# Patient Record
Sex: Male | Born: 1945 | Race: White | Hispanic: No | Smoking: Former smoker
Health system: Southern US, Community
[De-identification: ages and names within clinical notes are randomized; demographics above are authoritative.]

## PROBLEM LIST (undated history)

## (undated) DIAGNOSIS — F17200 Nicotine dependence, unspecified, uncomplicated: Secondary | ICD-10-CM

## (undated) DIAGNOSIS — E7800 Pure hypercholesterolemia, unspecified: Secondary | ICD-10-CM

## (undated) DIAGNOSIS — I10 Essential (primary) hypertension: Secondary | ICD-10-CM

## (undated) DIAGNOSIS — N529 Male erectile dysfunction, unspecified: Secondary | ICD-10-CM

## (undated) DIAGNOSIS — G4730 Sleep apnea, unspecified: Secondary | ICD-10-CM

## (undated) HISTORY — PX: BACK SURGERY: SHX140

---

## 1999-06-23 ENCOUNTER — Ambulatory Visit (HOSPITAL_COMMUNITY): Admission: RE | Admit: 1999-06-23 | Discharge: 1999-06-23 | Payer: Self-pay | Admitting: Orthopedic Surgery

## 1999-06-23 ENCOUNTER — Encounter: Payer: Self-pay | Admitting: Orthopedic Surgery

## 1999-07-06 ENCOUNTER — Encounter: Payer: Self-pay | Admitting: Orthopedic Surgery

## 1999-07-06 ENCOUNTER — Ambulatory Visit (HOSPITAL_COMMUNITY): Admission: RE | Admit: 1999-07-06 | Discharge: 1999-07-06 | Payer: Self-pay | Admitting: Orthopedic Surgery

## 1999-07-20 ENCOUNTER — Encounter: Payer: Self-pay | Admitting: Orthopedic Surgery

## 1999-07-20 ENCOUNTER — Ambulatory Visit (HOSPITAL_COMMUNITY): Admission: RE | Admit: 1999-07-20 | Discharge: 1999-07-20 | Payer: Self-pay | Admitting: Orthopedic Surgery

## 1999-08-03 ENCOUNTER — Encounter: Payer: Self-pay | Admitting: Orthopedic Surgery

## 1999-08-03 ENCOUNTER — Ambulatory Visit (HOSPITAL_COMMUNITY): Admission: RE | Admit: 1999-08-03 | Discharge: 1999-08-03 | Payer: Self-pay | Admitting: Orthopedic Surgery

## 2000-01-12 ENCOUNTER — Encounter: Payer: Self-pay | Admitting: Specialist

## 2000-01-19 ENCOUNTER — Observation Stay (HOSPITAL_COMMUNITY): Admission: RE | Admit: 2000-01-19 | Discharge: 2000-01-20 | Payer: Self-pay | Admitting: Specialist

## 2000-01-19 ENCOUNTER — Encounter: Payer: Self-pay | Admitting: Specialist

## 2009-04-06 ENCOUNTER — Ambulatory Visit: Payer: Self-pay | Admitting: Gastroenterology

## 2015-02-18 ENCOUNTER — Other Ambulatory Visit: Payer: Self-pay | Admitting: Family Medicine

## 2015-02-18 DIAGNOSIS — Z0000 Encounter for general adult medical examination without abnormal findings: Secondary | ICD-10-CM

## 2015-02-22 ENCOUNTER — Other Ambulatory Visit: Payer: Self-pay | Admitting: Family Medicine

## 2015-02-22 ENCOUNTER — Ambulatory Visit
Admission: RE | Admit: 2015-02-22 | Discharge: 2015-02-22 | Disposition: A | Discharge status: Home / Self Care | Payer: Medicare Other | Source: Ambulatory Visit | Attending: Family Medicine | Admitting: Family Medicine

## 2015-02-22 DIAGNOSIS — F17200 Nicotine dependence, unspecified, uncomplicated: Secondary | ICD-10-CM

## 2015-02-22 DIAGNOSIS — E7800 Pure hypercholesterolemia, unspecified: Secondary | ICD-10-CM

## 2015-02-22 DIAGNOSIS — I77811 Abdominal aortic ectasia: Secondary | ICD-10-CM | POA: Insufficient documentation

## 2015-02-22 DIAGNOSIS — I10 Essential (primary) hypertension: Secondary | ICD-10-CM

## 2015-02-22 DIAGNOSIS — Z0000 Encounter for general adult medical examination without abnormal findings: Secondary | ICD-10-CM

## 2015-02-22 DIAGNOSIS — IMO0001 Reserved for inherently not codable concepts without codable children: Secondary | ICD-10-CM

## 2015-02-22 DIAGNOSIS — F17210 Nicotine dependence, cigarettes, uncomplicated: Secondary | ICD-10-CM | POA: Insufficient documentation

## 2015-02-22 DIAGNOSIS — Z800 Family history of malignant neoplasm of digestive organs: Secondary | ICD-10-CM | POA: Insufficient documentation

## 2015-02-22 DIAGNOSIS — Z136 Encounter for screening for cardiovascular disorders: Secondary | ICD-10-CM | POA: Insufficient documentation

## 2015-02-22 HISTORY — DX: Essential (primary) hypertension: I10

## 2015-02-22 HISTORY — DX: Nicotine dependence, unspecified, uncomplicated: F17.200

## 2015-05-05 ENCOUNTER — Encounter: Payer: Self-pay | Admitting: *Deleted

## 2015-05-06 ENCOUNTER — Ambulatory Visit
Admission: RE | Admit: 2015-05-06 | Discharge: 2015-05-06 | Disposition: A | Discharge status: Home / Self Care | Payer: Medicare Other | Source: Ambulatory Visit | Attending: Unknown Physician Specialty | Admitting: Unknown Physician Specialty

## 2015-05-06 ENCOUNTER — Ambulatory Visit: Payer: Medicare Other | Admitting: Anesthesiology

## 2015-05-06 ENCOUNTER — Encounter: Payer: Self-pay | Admitting: *Deleted

## 2015-05-06 ENCOUNTER — Encounter
Admission: RE | Disposition: A | Discharge status: Home / Self Care | Payer: Self-pay | Source: Ambulatory Visit | Attending: Unknown Physician Specialty

## 2015-05-06 DIAGNOSIS — Z79899 Other long term (current) drug therapy: Secondary | ICD-10-CM | POA: Insufficient documentation

## 2015-05-06 DIAGNOSIS — D124 Benign neoplasm of descending colon: Secondary | ICD-10-CM | POA: Diagnosis not present

## 2015-05-06 DIAGNOSIS — J449 Chronic obstructive pulmonary disease, unspecified: Secondary | ICD-10-CM | POA: Insufficient documentation

## 2015-05-06 DIAGNOSIS — Z86010 Personal history of colonic polyps: Secondary | ICD-10-CM | POA: Diagnosis present

## 2015-05-06 DIAGNOSIS — E780 Pure hypercholesterolemia: Secondary | ICD-10-CM | POA: Diagnosis not present

## 2015-05-06 DIAGNOSIS — F17210 Nicotine dependence, cigarettes, uncomplicated: Secondary | ICD-10-CM | POA: Insufficient documentation

## 2015-05-06 DIAGNOSIS — N529 Male erectile dysfunction, unspecified: Secondary | ICD-10-CM | POA: Diagnosis not present

## 2015-05-06 DIAGNOSIS — D123 Benign neoplasm of transverse colon: Secondary | ICD-10-CM | POA: Insufficient documentation

## 2015-05-06 DIAGNOSIS — D120 Benign neoplasm of cecum: Secondary | ICD-10-CM | POA: Diagnosis not present

## 2015-05-06 DIAGNOSIS — D122 Benign neoplasm of ascending colon: Secondary | ICD-10-CM | POA: Diagnosis not present

## 2015-05-06 DIAGNOSIS — I10 Essential (primary) hypertension: Secondary | ICD-10-CM | POA: Insufficient documentation

## 2015-05-06 HISTORY — PX: COLONOSCOPY WITH PROPOFOL: SHX5780

## 2015-05-06 HISTORY — DX: Pure hypercholesterolemia, unspecified: E78.00

## 2015-05-06 HISTORY — DX: Male erectile dysfunction, unspecified: N52.9

## 2015-05-06 SURGERY — COLONOSCOPY WITH PROPOFOL
Anesthesia: General

## 2015-05-06 MED ORDER — SODIUM CHLORIDE 0.9 % IV SOLN: INTRAVENOUS | Status: DC

## 2015-05-06 MED ORDER — EPHEDRINE SULFATE 50 MG/ML IJ SOLN
INTRAMUSCULAR | Status: DC | PRN
  Administered 2015-05-06 (×2): 5 mg via INTRAVENOUS

## 2015-05-06 MED ORDER — MIDAZOLAM HCL 5 MG/5ML IJ SOLN
INTRAMUSCULAR | Status: DC | PRN
  Administered 2015-05-06: 1 mg via INTRAVENOUS

## 2015-05-06 MED ORDER — PHENYLEPHRINE HCL 10 MG/ML IJ SOLN
INTRAMUSCULAR | Status: DC | PRN
  Administered 2015-05-06: 50 ug via INTRAVENOUS
  Administered 2015-05-06: 200 ug via INTRAVENOUS
  Administered 2015-05-06: 50 ug via INTRAVENOUS
  Administered 2015-05-06: 100 ug via INTRAVENOUS

## 2015-05-06 MED ORDER — FENTANYL CITRATE (PF) 100 MCG/2ML IJ SOLN
INTRAMUSCULAR | Status: DC | PRN
  Administered 2015-05-06: 50 ug via INTRAVENOUS

## 2015-05-06 MED ORDER — SODIUM CHLORIDE 0.9 % IV SOLN
INTRAVENOUS | Status: DC
  Administered 2015-05-06: 14:00:00 via INTRAVENOUS

## 2015-05-06 MED ORDER — PROPOFOL INFUSION 10 MG/ML OPTIME
INTRAVENOUS | Status: DC | PRN
Start: 2015-05-06 — End: 2015-05-06
  Administered 2015-05-06: 120 ug/kg/min via INTRAVENOUS

## 2015-05-06 MED ORDER — PROPOFOL 10 MG/ML IV BOLUS
INTRAVENOUS | Status: DC | PRN
  Administered 2015-05-06: 55 mg via INTRAVENOUS

## 2015-05-06 NOTE — Transfer of Care (Signed)
Immediate Anesthesia Transfer of Care Note  Patient: Caleb Pena  Procedure(s) Performed: Procedure(s): COLONOSCOPY WITH PROPOFOL (N/A)  Patient Location: PACU  Anesthesia Type:General  Level of Consciousness: awake, alert  and oriented  Airway & Oxygen Therapy: Patient Spontanous Breathing and Patient connected to nasal cannula oxygen  Post-op Assessment: Report given to RN and Post -op Vital signs reviewed and stable  Post vital signs: stable  Last Vitals:  Filed Vitals:   05/06/15 1544  BP: 96/70  Pulse: 68  Temp: 35.9 C  Resp: 15    Complications: No apparent anesthesia complications

## 2015-05-06 NOTE — Op Note (Signed)
[facility] Gastroenterology Patient Name: Caleb Pena Procedure Date: 05/06/2015 2:58 PM MRN: [id number] Account #: [id number] Date of Birth: 1945/12/04 Admit Type: Outpatient Age: 69 Room: [facility] ENDO ROOM 1 Gender: Male Note Status: Finalized Procedure:         Colonoscopy Indications:       Personal history of colonic polyps Providers:         Manya Silvas, MD Referring MD:      Truitt Leep. Ingledue (Referring MD) Medicines:         Propofol per Anesthesia Complications:     No immediate complications. Procedure:         Pre-Anesthesia Assessment:                    - After reviewing the risks and benefits, the patient was                     deemed in satisfactory condition to undergo the procedure.                    After obtaining informed consent, the colonoscope was                     passed under direct vision. Throughout the procedure, the                     patient's blood pressure, pulse, and oxygen saturations                     were monitored continuously. The Colonoscope was                     introduced through the anus and advanced to the the cecum,                     identified by appendiceal orifice and ileocecal valve. The                     colonoscopy was somewhat difficult due to significant                     looping. The patient tolerated the procedure well. The                     quality of the bowel preparation was good. Findings:      Two sessile polyps were found at the hepatic flexure. The polyps were       diminutive in size. These polyps were removed with a jumbo cold forceps.       Resection and retrieval were complete.      Two sessile polyps were found in the cecum. The polyps were diminutive       in size. These polyps were removed with a jumbo cold forceps. Resection       and retrieval were complete.      A small polyp was found in the ascending colon. The polyp was sessile.       The polyp was removed with a hot  snare. Resection and retrieval were       complete. To prevent bleeding after the polypectomy, three hemostatic       clips were successfully placed (MRI compatible). There was no bleeding       during, and at the end, of the procedure.      A  small polyp was found in the transverse colon. The polyp was sessile.       The polyp was removed with a hot snare. Resection and retrieval were       complete.      Three sessile polyps were found in the descending colon. The polyps were       diminutive in size. These polyps were removed with a cold snare.       Resection and retrieval were complete.      A diminutive polyp was found in the ascending colon. The polyp was       sessile. The polyp was removed with a jumbo cold forceps. Resection and       retrieval were complete. Impression:        - Two diminutive polyps at the hepatic flexure. Resected                     and retrieved.                    - Two diminutive polyps in the cecum. Resected and                     retrieved.                    - One small polyp in the ascending colon. Resected and                     retrieved. Clips were placed.                    - One small polyp in the transverse colon. Resected and                     retrieved.                    - Three diminutive polyps in the descending colon.                     Resected and retrieved.                    - One diminutive polyp in the ascending colon. Resected                     and retrieved. Recommendation:    - Await pathology results. Manya Silvas, MD 05/06/2015 3:45:53 PM This report has been signed electronically. Number of Addenda: 0 Note Initiated On: 05/06/2015 2:58 PM Scope Withdrawal Time: 0 hours 29 minutes 15 seconds  Total Procedure Duration: 0 hours 38 minutes 3 seconds       [facility]

## 2015-05-06 NOTE — Anesthesia Postprocedure Evaluation (Signed)
  Anesthesia Post-op Note  Patient: Caleb Pena  Procedure(s) Performed: Procedure(s): COLONOSCOPY WITH PROPOFOL (N/A)  Anesthesia type:General  Patient location: PACU  Post pain: Pain level controlled  Post assessment: Post-op Vital signs reviewed, Patient's Cardiovascular Status Stable, Respiratory Function Stable, Patent Airway and No signs of Nausea or vomiting  Post vital signs: Reviewed and stable  Last Vitals:  Filed Vitals:   05/06/15 1550  BP: 106/72  Pulse: 76  Temp:   Resp: 15    Level of consciousness: awake, alert  and patient cooperative  Complications: No apparent anesthesia complications

## 2015-05-06 NOTE — H&P (Signed)
Primary Care Physician:  Chrisandra Carota, MD Primary Gastroenterologist:  Dr. Vira Agar  Pre-Procedure History & Physical: HPI:  Caleb Pena is a 69 y.o. male is here for an colonoscopy.   Past Medical History  Diagnosis Date  . Hypertension   . Smoker   . Hypercholesterolemia   . ED (erectile dysfunction)     Past Surgical History  Procedure Laterality Date  . Back surgery      Prior to Admission medications   Medication Sig Start Date End Date Taking? Authorizing Provider  buPROPion (WELLBUTRIN SR) 150 MG 12 hr tablet Take 150 mg by mouth 2 (two) times daily.   Yes Historical Provider, MD  losartan (COZAAR) 50 MG tablet Take 50 mg by mouth daily.   Yes Historical Provider, MD  rOPINIRole (REQUIP) 0.25 MG tablet Take 0.25 mg by mouth 3 (three) times daily.   Yes Historical Provider, MD  sildenafil (REVATIO) 20 MG tablet Take 20 mg by mouth 3 (three) times daily.   Yes Historical Provider, MD  simvastatin (ZOCOR) 40 MG tablet Take 40 mg by mouth daily.   Yes Historical Provider, MD  lisinopril (PRINIVIL,ZESTRIL) 5 MG tablet Take 5 mg by mouth daily.    Historical Provider, MD    Allergies as of 05/04/2015  . (Not on File)    History reviewed. No pertinent family history.  History   Social History  . Marital Status: Widowed    Spouse Name: N/A  . Number of Children: N/A  . Years of Education: N/A   Occupational History  . Not on file.   Social History Main Topics  . Smoking status: Current Every Day Smoker -- 0.50 packs/day    Types: Cigarettes  . Smokeless tobacco: Former Systems developer    Types: Chew    Quit date: 05/05/1983  . Alcohol Use: Not on file  . Drug Use: Not on file  . Sexual Activity: Not on file   Other Topics Concern  . Not on file   Social History Narrative    Review of Systems: See HPI, otherwise negative ROS  Physical Exam: BP 117/98 mmHg  Pulse 85  Temp(Src) 97.3 F (36.3 C) (Tympanic)  Resp 12  Ht 5\' 11"  (1.803 m)  Wt 88.905 kg (196  lb)  BMI 27.35 kg/m2  SpO2 100% General:   Alert,  pleasant and cooperative in NAD Head:  Normocephalic and atraumatic. Neck:  Supple; no masses or thyromegaly. Lungs:  Clear throughout to auscultation.    Heart:  Regular rate and rhythm. Abdomen:  Soft, nontender and nondistended. Normal bowel sounds, without guarding, and without rebound.   Neurologic:  Alert and  oriented x4;  grossly normal neurologically.  Impression/Plan: Caleb Pena is here for an colonoscopy to be performed for personal hx colon polyps  Risks, benefits, limitations, and alternatives regarding  colonoscopy have been reviewed with the patient.  Questions have been answered.  All parties agreeable.   Caleb Cheers, MD  05/06/2015, 2:57 PM   Primary Care Physician:  Chrisandra Carota, MD Primary Gastroenterologist:  Dr. Vira Agar  Pre-Procedure History & Physical: HPI:  Caleb Pena is a 69 y.o. male is here for an colonoscopy.   Past Medical History  Diagnosis Date  . Hypertension   . Smoker   . Hypercholesterolemia   . ED (erectile dysfunction)     Past Surgical History  Procedure Laterality Date  . Back surgery      Prior to Admission medications   Medication Sig Start  Date End Date Taking? Authorizing Provider  buPROPion (WELLBUTRIN SR) 150 MG 12 hr tablet Take 150 mg by mouth 2 (two) times daily.   Yes Historical Provider, MD  losartan (COZAAR) 50 MG tablet Take 50 mg by mouth daily.   Yes Historical Provider, MD  rOPINIRole (REQUIP) 0.25 MG tablet Take 0.25 mg by mouth 3 (three) times daily.   Yes Historical Provider, MD  sildenafil (REVATIO) 20 MG tablet Take 20 mg by mouth 3 (three) times daily.   Yes Historical Provider, MD  simvastatin (ZOCOR) 40 MG tablet Take 40 mg by mouth daily.   Yes Historical Provider, MD  lisinopril (PRINIVIL,ZESTRIL) 5 MG tablet Take 5 mg by mouth daily.    Historical Provider, MD    Allergies as of 05/04/2015  . (Not on File)    History reviewed. No pertinent  family history.  History   Social History  . Marital Status: Widowed    Spouse Name: N/A  . Number of Children: N/A  . Years of Education: N/A   Occupational History  . Not on file.   Social History Main Topics  . Smoking status: Current Every Day Smoker -- 0.50 packs/day    Types: Cigarettes  . Smokeless tobacco: Former Systems developer    Types: Chew    Quit date: 05/05/1983  . Alcohol Use: Not on file  . Drug Use: Not on file  . Sexual Activity: Not on file   Other Topics Concern  . Not on file   Social History Narrative    Review of Systems: See HPI, otherwise negative ROS  Physical Exam: BP 117/98 mmHg  Pulse 85  Temp(Src) 97.3 F (36.3 C) (Tympanic)  Resp 12  Ht 5\' 11"  (1.803 m)  Wt 88.905 kg (196 lb)  BMI 27.35 kg/m2  SpO2 100% General:   Alert,  pleasant and cooperative in NAD Head:  Normocephalic and atraumatic. Neck:  Supple; no masses or thyromegaly. Lungs:  Clear throughout to auscultation.    Heart:  Regular rate and rhythm. Abdomen:  Soft, nontender and nondistended. Normal bowel sounds, without guarding, and without rebound.   Neurologic:  Alert and  oriented x4;  grossly normal neurologically.  Impression/Plan: Caleb Pena is here for an colonoscopy to be performed for personal history colon polyps  Risks, benefits, limitations, and alternatives regarding  colonoscopy have been reviewed with the patient.  Questions have been answered.  All parties agreeable.   Caleb Cheers, MD  05/06/2015, 2:57 PM

## 2015-05-06 NOTE — Anesthesia Preprocedure Evaluation (Signed)
Anesthesia Evaluation  Patient identified by MRN, date of birth, ID band Patient awake    Reviewed: Allergy & Precautions, NPO status , Patient's Chart, lab work & pertinent test results  Airway Mallampati: III       Dental  (+) Chipped   Pulmonary COPDCurrent Smoker,  + rhonchi         Cardiovascular hypertension, Pt. on medications Normal cardiovascular exam    Neuro/Psych    GI/Hepatic negative GI ROS, Neg liver ROS,   Endo/Other  negative endocrine ROS  Renal/GU negative Renal ROS     Musculoskeletal negative musculoskeletal ROS (+)   Abdominal Normal abdominal exam  (+)   Peds  Hematology negative hematology ROS (+)   Anesthesia Other Findings   Reproductive/Obstetrics negative OB ROS                             Anesthesia Physical Anesthesia Plan  ASA: II  Anesthesia Plan: General   Post-op Pain Management:    Induction: Intravenous  Airway Management Planned: Nasal Cannula  Additional Equipment:   Intra-op Plan:   Post-operative Plan:   Informed Consent: I have reviewed the patients History and Physical, chart, labs and discussed the procedure including the risks, benefits and alternatives for the proposed anesthesia with the patient or authorized representative who has indicated his/her understanding and acceptance.     Plan Discussed with: CRNA  Anesthesia Plan Comments:         Anesthesia Quick Evaluation

## 2015-05-10 ENCOUNTER — Encounter: Payer: Self-pay | Admitting: Unknown Physician Specialty

## 2015-05-10 LAB — SURGICAL PATHOLOGY

## 2016-01-10 ENCOUNTER — Other Ambulatory Visit: Payer: Self-pay | Admitting: Family Medicine

## 2016-01-10 DIAGNOSIS — Z136 Encounter for screening for cardiovascular disorders: Secondary | ICD-10-CM

## 2018-10-10 ENCOUNTER — Inpatient Hospital Stay
Admission: EM | Admit: 2018-10-10 | Discharge: 2018-10-12 | DRG: 683 | Disposition: A | Discharge status: Home / Self Care | Payer: Medicare Other | Attending: Internal Medicine | Admitting: Internal Medicine

## 2018-10-10 ENCOUNTER — Encounter: Payer: Self-pay | Admitting: Emergency Medicine

## 2018-10-10 ENCOUNTER — Emergency Department: Payer: Medicare Other

## 2018-10-10 ENCOUNTER — Other Ambulatory Visit: Payer: Self-pay

## 2018-10-10 DIAGNOSIS — I10 Essential (primary) hypertension: Secondary | ICD-10-CM | POA: Diagnosis present

## 2018-10-10 DIAGNOSIS — R197 Diarrhea, unspecified: Secondary | ICD-10-CM | POA: Diagnosis not present

## 2018-10-10 DIAGNOSIS — G2581 Restless legs syndrome: Secondary | ICD-10-CM | POA: Diagnosis present

## 2018-10-10 DIAGNOSIS — Z79899 Other long term (current) drug therapy: Secondary | ICD-10-CM | POA: Diagnosis not present

## 2018-10-10 DIAGNOSIS — A0811 Acute gastroenteropathy due to Norwalk agent: Secondary | ICD-10-CM | POA: Diagnosis present

## 2018-10-10 DIAGNOSIS — K219 Gastro-esophageal reflux disease without esophagitis: Secondary | ICD-10-CM | POA: Diagnosis present

## 2018-10-10 DIAGNOSIS — F329 Major depressive disorder, single episode, unspecified: Secondary | ICD-10-CM | POA: Diagnosis present

## 2018-10-10 DIAGNOSIS — Z888 Allergy status to other drugs, medicaments and biological substances status: Secondary | ICD-10-CM | POA: Diagnosis not present

## 2018-10-10 DIAGNOSIS — K529 Noninfective gastroenteritis and colitis, unspecified: Secondary | ICD-10-CM

## 2018-10-10 DIAGNOSIS — E7800 Pure hypercholesterolemia, unspecified: Secondary | ICD-10-CM | POA: Diagnosis present

## 2018-10-10 DIAGNOSIS — R109 Unspecified abdominal pain: Secondary | ICD-10-CM

## 2018-10-10 DIAGNOSIS — Z803 Family history of malignant neoplasm of breast: Secondary | ICD-10-CM | POA: Diagnosis not present

## 2018-10-10 DIAGNOSIS — K56609 Unspecified intestinal obstruction, unspecified as to partial versus complete obstruction: Secondary | ICD-10-CM

## 2018-10-10 DIAGNOSIS — N179 Acute kidney failure, unspecified: Principal | ICD-10-CM | POA: Diagnosis present

## 2018-10-10 DIAGNOSIS — F419 Anxiety disorder, unspecified: Secondary | ICD-10-CM | POA: Diagnosis present

## 2018-10-10 DIAGNOSIS — E785 Hyperlipidemia, unspecified: Secondary | ICD-10-CM | POA: Diagnosis present

## 2018-10-10 DIAGNOSIS — F17210 Nicotine dependence, cigarettes, uncomplicated: Secondary | ICD-10-CM | POA: Diagnosis present

## 2018-10-10 DIAGNOSIS — K56600 Partial intestinal obstruction, unspecified as to cause: Secondary | ICD-10-CM | POA: Diagnosis present

## 2018-10-10 DIAGNOSIS — Z800 Family history of malignant neoplasm of digestive organs: Secondary | ICD-10-CM

## 2018-10-10 DIAGNOSIS — R112 Nausea with vomiting, unspecified: Secondary | ICD-10-CM | POA: Insufficient documentation

## 2018-10-10 DIAGNOSIS — E860 Dehydration: Secondary | ICD-10-CM | POA: Diagnosis present

## 2018-10-10 LAB — URINALYSIS, COMPLETE (UACMP) WITH MICROSCOPIC
Bacteria, UA: NONE SEEN
Bilirubin Urine: NEGATIVE
Glucose, UA: NEGATIVE mg/dL
Ketones, ur: NEGATIVE mg/dL
Leukocytes, UA: NEGATIVE
NITRITE: NEGATIVE
PROTEIN: 30 mg/dL — AB
SPECIFIC GRAVITY, URINE: 1.025 (ref 1.005–1.030)
Waxy Casts, UA: 12
pH: 5 (ref 5.0–8.0)

## 2018-10-10 LAB — GASTROINTESTINAL PANEL BY PCR, STOOL (REPLACES STOOL CULTURE)
ADENOVIRUS F40/41: NOT DETECTED
Astrovirus: NOT DETECTED
CAMPYLOBACTER SPECIES: NOT DETECTED
CYCLOSPORA CAYETANENSIS: NOT DETECTED
Cryptosporidium: NOT DETECTED
ENTEROPATHOGENIC E COLI (EPEC): NOT DETECTED
Entamoeba histolytica: NOT DETECTED
Enteroaggregative E coli (EAEC): NOT DETECTED
Enterotoxigenic E coli (ETEC): NOT DETECTED
Giardia lamblia: NOT DETECTED
Norovirus GI/GII: DETECTED — AB
PLESIMONAS SHIGELLOIDES: NOT DETECTED
ROTAVIRUS A: NOT DETECTED
SALMONELLA SPECIES: NOT DETECTED
SHIGA LIKE TOXIN PRODUCING E COLI (STEC): NOT DETECTED
SHIGELLA/ENTEROINVASIVE E COLI (EIEC): NOT DETECTED
Sapovirus (I, II, IV, and V): NOT DETECTED
Vibrio cholerae: NOT DETECTED
Vibrio species: NOT DETECTED
Yersinia enterocolitica: NOT DETECTED

## 2018-10-10 LAB — COMPREHENSIVE METABOLIC PANEL
ALK PHOS: 50 U/L (ref 38–126)
ALT: 42 U/L (ref 0–44)
AST: 50 U/L — AB (ref 15–41)
Albumin: 4.4 g/dL (ref 3.5–5.0)
Anion gap: 16 — ABNORMAL HIGH (ref 5–15)
BILIRUBIN TOTAL: 0.9 mg/dL (ref 0.3–1.2)
BUN: 50 mg/dL — AB (ref 8–23)
CALCIUM: 9.1 mg/dL (ref 8.9–10.3)
CO2: 25 mmol/L (ref 22–32)
Chloride: 96 mmol/L — ABNORMAL LOW (ref 98–111)
Creatinine, Ser: 2.69 mg/dL — ABNORMAL HIGH (ref 0.61–1.24)
GFR calc Af Amer: 27 mL/min — ABNORMAL LOW (ref >60)
GFR, EST NON AFRICAN AMERICAN: 23 mL/min — AB (ref >60)
Glucose, Bld: 161 mg/dL — ABNORMAL HIGH (ref 70–99)
POTASSIUM: 3.7 mmol/L (ref 3.5–5.1)
Sodium: 137 mmol/L (ref 135–145)
Total Protein: 8.2 g/dL — ABNORMAL HIGH (ref 6.5–8.1)

## 2018-10-10 LAB — CBC
HCT: 49.7 % (ref 39.0–52.0)
HEMOGLOBIN: 16.8 g/dL (ref 13.0–17.0)
MCH: 30.1 pg (ref 26.0–34.0)
MCHC: 33.8 g/dL (ref 30.0–36.0)
MCV: 88.9 fL (ref 80.0–100.0)
Platelets: 254 10*3/uL (ref 150–400)
RBC: 5.59 MIL/uL (ref 4.22–5.81)
RDW: 13.1 % (ref 11.5–15.5)
WBC: 7.5 10*3/uL (ref 4.0–10.5)
nRBC: 0 % (ref 0.0–0.2)

## 2018-10-10 LAB — INFLUENZA PANEL BY PCR (TYPE A & B)
INFLBPCR: NEGATIVE
Influenza A By PCR: NEGATIVE

## 2018-10-10 LAB — LIPASE, BLOOD: Lipase: 23 U/L (ref 11–51)

## 2018-10-10 LAB — CK: Total CK: 592 U/L — ABNORMAL HIGH (ref 49–397)

## 2018-10-10 MED ORDER — ACETAMINOPHEN 650 MG RE SUPP: 650.0000 mg | Freq: Four times a day (QID) | RECTAL | Status: DC | PRN

## 2018-10-10 MED ORDER — PANTOPRAZOLE SODIUM 40 MG IV SOLR
40.0000 mg | Freq: Two times a day (BID) | INTRAVENOUS | Status: DC
  Administered 2018-10-10 – 2018-10-11 (×2): 40 mg via INTRAVENOUS
  Filled 2018-10-10 (×2): qty 40

## 2018-10-10 MED ORDER — SODIUM CHLORIDE 0.9 % IV SOLN
INTRAVENOUS | Status: DC
  Administered 2018-10-10 – 2018-10-11 (×3): via INTRAVENOUS

## 2018-10-10 MED ORDER — SODIUM CHLORIDE 0.9 % IV BOLUS
500.0000 mL | Freq: Once | INTRAVENOUS | Status: AC
  Administered 2018-10-10: 500 mL via INTRAVENOUS

## 2018-10-10 MED ORDER — TRAZODONE HCL 50 MG PO TABS: 25.0000 mg | ORAL_TABLET | Freq: Every evening | ORAL | Status: DC | PRN

## 2018-10-10 MED ORDER — BUPROPION HCL ER (SR) 150 MG PO TB12
150.0000 mg | ORAL_TABLET | Freq: Two times a day (BID) | ORAL | Status: DC
  Filled 2018-10-10 (×5): qty 1

## 2018-10-10 MED ORDER — HEPARIN SODIUM (PORCINE) 5000 UNIT/ML IJ SOLN
5000.0000 [IU] | Freq: Three times a day (TID) | INTRAMUSCULAR | Status: DC
  Administered 2018-10-10 – 2018-10-11 (×2): 5000 [IU] via SUBCUTANEOUS
  Filled 2018-10-10 (×4): qty 1

## 2018-10-10 MED ORDER — ONDANSETRON HCL 4 MG/2ML IJ SOLN: 4.0000 mg | Freq: Four times a day (QID) | INTRAMUSCULAR | Status: DC | PRN

## 2018-10-10 MED ORDER — SODIUM CHLORIDE 0.9 % IV BOLUS
1000.0000 mL | Freq: Once | INTRAVENOUS | Status: AC
  Administered 2018-10-10: 1000 mL via INTRAVENOUS

## 2018-10-10 MED ORDER — ONDANSETRON HCL 40 MG/20ML IJ SOLN
8.0000 mg | Freq: Once | INTRAMUSCULAR | Status: AC
  Administered 2018-10-10: 8 mg via INTRAVENOUS
  Filled 2018-10-10: qty 4

## 2018-10-10 MED ORDER — ONDANSETRON HCL 4 MG PO TABS: 4.0000 mg | ORAL_TABLET | Freq: Four times a day (QID) | ORAL | Status: DC | PRN

## 2018-10-10 MED ORDER — ROPINIROLE HCL 0.25 MG PO TABS
0.2500 mg | ORAL_TABLET | Freq: Three times a day (TID) | ORAL | Status: DC
  Administered 2018-10-10 – 2018-10-12 (×3): 0.25 mg via ORAL
  Filled 2018-10-10 (×8): qty 1

## 2018-10-10 MED ORDER — ACETAMINOPHEN 325 MG PO TABS: 650.0000 mg | ORAL_TABLET | Freq: Four times a day (QID) | ORAL | Status: DC | PRN

## 2018-10-10 NOTE — Consult Note (Signed)
Date of Consultation:  10/10/2018  Requesting Physician:  Delman Kitten, MD  Reason for Consultation:  Nausea, vomiting, diarrhea  History of Present Illness: Caleb Pena is a 72 y.o. male presenting with a two day history of nausea, vomiting, and diarrhea.  He had a salad at [facility] Tuesday on 12/24 and hours later started having diarrhea and multiple bouts of emesis.  The diarrhea subsided on 12/26 but he continues having nausea and vomiting.  He is still having flatus and had some today.  He reports abdominal pain, but describes it as muscular due to all the straining from vomiting.  He denies any chest pain or shortness of breath, fevers, or chills.  He denies any sick contacts and his fiancee who also ate at [facility] Tuesday has not had any symptoms.    Of note, he has not had any abdominal surgeries in the past.  He had a colonoscopy in July 2016 which found polyps throughout his colon which resulted in tubular adenomas without any high grade dysplasia or malignancy.  He reports another colonoscopy July 2019 but I cannot find any reports on it.  Past Medical History: Past Medical History:  Diagnosis Date  . ED (erectile dysfunction)   . Hypercholesterolemia   . Hypertension   . Smoker      Past Surgical History: Past Surgical History:  Procedure Laterality Date  . BACK SURGERY    . COLONOSCOPY WITH PROPOFOL N/A 05/06/2015   Procedure: COLONOSCOPY WITH PROPOFOL;  Surgeon: Manya Silvas, MD;  Location: [facility] ENDOSCOPY;  Service: Endoscopy;  Laterality: N/A;    Home Medications: Prior to Admission medications   Medication Sig Start Date End Date Taking? Authorizing Provider  buPROPion (WELLBUTRIN SR) 150 MG 12 hr tablet Take 150 mg by mouth 2 (two) times daily.    [provider]  lisinopril (PRINIVIL,ZESTRIL) 5 MG tablet Take 5 mg by mouth daily.    [provider]  losartan (COZAAR) 50 MG tablet Take 50 mg by mouth daily.    [provider]  rOPINIRole  (REQUIP) 0.25 MG tablet Take 0.25 mg by mouth 3 (three) times daily.    [provider]  sildenafil (REVATIO) 20 MG tablet Take 20 mg by mouth 3 (three) times daily.    [provider]  simvastatin (ZOCOR) 40 MG tablet Take 40 mg by mouth daily.    [provider]    Allergies: Allergies  Allergen Reactions  . Lisinopril Cough    Social History:  reports that he has been smoking cigarettes. He has been smoking about 0.50 packs per day. He quit smokeless tobacco use about 35 years ago.  His smokeless tobacco use included chew. No history on file for alcohol and drug.   Family History: History of breast cancer in his mother, colon cancer in his father, atrial fibrillation in his sister.  Review of Systems: Review of Systems  Constitutional: Negative for chills and fever.  HENT: Negative for hearing loss.   Respiratory: Negative for shortness of breath.   Cardiovascular: Negative for chest pain.  Gastrointestinal: Positive for abdominal pain, diarrhea, nausea and vomiting.  Genitourinary: Negative for dysuria.  Musculoskeletal: Positive for myalgias.  Skin: Negative for rash.  Neurological: Negative for dizziness.  Psychiatric/Behavioral: Negative for depression.    Physical Exam BP 121/85 (BP Location: Left Arm)   Pulse (!) 112   Temp 98.9 F (37.2 C) (Oral)   Resp (!) 22   Ht 5\' 10"  (1.778 m)   Wt  95.3 kg   SpO2 96%   BMI 30.13 kg/m  CONSTITUTIONAL: No acute distress HEENT:  Normocephalic, atraumatic, extraocular motion intact. NECK: Trachea is midline, and there is no jugular venous distension. RESPIRATORY:  Lungs are clear, and breath sounds are equal bilaterally. Normal respiratory effort without pathologic use of accessory muscles. CARDIOVASCULAR: Heart is regular without murmurs, gallops, or rubs. GI: The abdomen is soft, mildly distended, non-tender to palpation. No abdominal scars or masses.  MUSCULOSKELETAL:  Normal muscle strength and  tone in all four extremities.  No peripheral edema or cyanosis. SKIN: Skin turgor is normal. There are no pathologic skin lesions.  NEUROLOGIC:  Motor and sensation is grossly normal.  Cranial nerves are grossly intact. PSYCH:  Alert and oriented to person, place and time. Affect is normal.  Laboratory Analysis: Results for orders placed or performed during the hospital encounter of 10/10/18 (from the past 24 hour(s))  Lipase, blood     Status: None   Collection Time: 10/10/18 12:37 PM  Result Value Ref Range   Lipase 23 11 - 51 U/L  Comprehensive metabolic panel     Status: Abnormal   Collection Time: 10/10/18 12:37 PM  Result Value Ref Range   Sodium 137 135 - 145 mmol/L   Potassium 3.7 3.5 - 5.1 mmol/L   Chloride 96 (L) 98 - 111 mmol/L   CO2 25 22 - 32 mmol/L   Glucose, Bld 161 (H) 70 - 99 mg/dL   BUN 50 (H) 8 - 23 mg/dL   Creatinine, Ser 2.69 (H) 0.61 - 1.24 mg/dL   Calcium 9.1 8.9 - 10.3 mg/dL   Total Protein 8.2 (H) 6.5 - 8.1 g/dL   Albumin 4.4 3.5 - 5.0 g/dL   AST 50 (H) 15 - 41 U/L   ALT 42 0 - 44 U/L   Alkaline Phosphatase 50 38 - 126 U/L   Total Bilirubin 0.9 0.3 - 1.2 mg/dL   GFR calc non Af Amer 23 (L) >60 mL/min   GFR calc Af Amer 27 (L) >60 mL/min   Anion gap 16 (H) 5 - 15  CBC     Status: None   Collection Time: 10/10/18 12:37 PM  Result Value Ref Range   WBC 7.5 4.0 - 10.5 K/uL   RBC 5.59 4.22 - 5.81 MIL/uL   Hemoglobin 16.8 13.0 - 17.0 g/dL   HCT 49.7 39.0 - 52.0 %   MCV 88.9 80.0 - 100.0 fL   MCH 30.1 26.0 - 34.0 pg   MCHC 33.8 30.0 - 36.0 g/dL   RDW 13.1 11.5 - 15.5 %   Platelets 254 150 - 400 K/uL   nRBC 0.0 0.0 - 0.2 %  CK     Status: Abnormal   Collection Time: 10/10/18 12:37 PM  Result Value Ref Range   Total CK 592 (H) 49 - 397 U/L  Influenza panel by PCR (type A & B)     Status: None   Collection Time: 10/10/18  4:15 PM  Result Value Ref Range   Influenza A By PCR NEGATIVE NEGATIVE   Influenza B By PCR NEGATIVE NEGATIVE    Imaging: Ct  Abdomen Pelvis Wo Contrast  Result Date: 10/10/2018 CLINICAL DATA:  Generalized abdominal pain with nausea, vomiting and diarrhea. EXAM: CT ABDOMEN AND PELVIS WITHOUT CONTRAST TECHNIQUE: Multidetector CT imaging of the abdomen and pelvis was performed following the standard protocol without IV contrast. COMPARISON:  None. FINDINGS: Lower chest: No acute abnormality. Hepatobiliary: Tiny cystic areas in the liver  likely relate to benign cysts. No gallstones, gallbladder wall thickening, or biliary dilatation. Pancreas: Unremarkable. No pancreatic ductal dilatation or surrounding inflammatory changes. Spleen: Normal in size without focal abnormality. Adrenals/Urinary Tract: Adrenal glands are unremarkable. Kidneys are normal, without renal calculi, focal lesion, or hydronephrosis. Bladder is unremarkable. Stomach/Bowel: Dilated and fluid-filled small bowel loops extend into the pelvis. Maximal small bowel caliber is approximately 4 cm. There is some relative transition in the pelvis to more normal caliber ileum. The colon contains some fluid and gas. Findings are likely consistent with partial small bowel obstruction or significant ileus. Diverticulosis of the sigmoid colon present without evidence of diverticulitis. No free air. No bowel lesions identified. Vascular/Lymphatic: No significant vascular findings are present. No enlarged abdominal or pelvic lymph nodes. Reproductive: Prostate is unremarkable. Other: Small left inguinal hernia contains fat. No abdominopelvic ascites. Musculoskeletal: Moderate degenerative disc disease throughout the lumbar spine. IMPRESSION: Dilated and fluid-filled small bowel loops extending into the pelvis. Findings are likely consistent with partial small bowel obstruction or significant small bowel ileus. Electronically Signed   By: Aletta Edouard M.D.   On: 10/10/2018 16:05    Assessment and Plan: This is a 72 y.o. male with nausea, vomiting, diarrhea.  I have independently  viewed the patient's imaging study and reviewed his laboratory studies.  Overall, his CT scan does show distended loops of small bowel, but no specific transition point, though distal small bowel is normal in caliber.  No areas of stranding or colitis.  His labs show acute renal failure with Cr of 2.69 (normal 1.3 on 04/22/18), and he is hemoconcentrated.  Discussed with the patient that without prior surgical history and with the timeline of his presentation, initial symptoms, and current symptoms, more likely he has a severe bout of gastroenteritis.  He could indeed have a partial small bowel obstruction, but this is less likely, and I did discuss with the patient that we may never fully find out which one it is.  I do not think he would need surgery and I think medical admission is appropriate.  I would recommend to keep him NPO given his nausea/vomiting, and give him aggressive IV fluid hydration.  He is aware that if there is no improvement in his symptoms, he may need an NG tube until his bowel function improves.  We will continue to follow.  Face-to-face time spent with the patient and care providers was 55 minutes, with more than 50% of the time spent counseling, educating, and coordinating care of the patient.     Melvyn Neth, MD [facility] Pg:  [phone]

## 2018-10-10 NOTE — ED Notes (Signed)
Patient transported to CT 

## 2018-10-10 NOTE — H&P (Signed)
[facility] NAME: Caleb Pena    MR#:  [id number]  DATE OF BIRTH:  11/12/1945  DATE OF ADMISSION:  10/10/2018  PRIMARY CARE PHYSICIAN: Clarisse Gouge, MD   REQUESTING/REFERRING PHYSICIAN: Dr. Delman Kitten  CHIEF COMPLAINT: Intractable nausea, vomiting   Chief Complaint  Patient presents with  . Emesis    HISTORY OF PRESENT ILLNESS:  Caleb Pena  is a 72 y.o. male with a known history of restless leg syndrome comes in because of intractable nausea, vomiting, diarrhea.  Patient ate salad a to be Tuesday, symptoms started after, started to have multiple episodes of nausea, vomiting, diarrhea, stomach cramping.  Diarrhea stopped but now but still has dry heaving.  Patient blood work showed acute acute kidney injury, CT abdomen concerning for partial small bowel obstruction.pt able to pass flattus.  PAST MEDICAL HISTORY:   Past Medical History:  Diagnosis Date  . ED (erectile dysfunction)   . Hypercholesterolemia   . Hypertension   . Smoker     PAST SURGICAL HISTOIRY:   Past Surgical History:  Procedure Laterality Date  . BACK SURGERY    . COLONOSCOPY WITH PROPOFOL N/A 05/06/2015   Procedure: COLONOSCOPY WITH PROPOFOL;  Surgeon: Manya Silvas, MD;  Location: [facility] ENDOSCOPY;  Service: Endoscopy;  Laterality: N/A;    SOCIAL HISTORY:   Social History   Tobacco Use  . Smoking status: Current Every Day Smoker    Packs/day: 0.50    Types: Cigarettes  . Smokeless tobacco: Former Systems developer    Types: Chew    Quit date: 05/05/1983  Substance Use Topics  . Alcohol use: Not on file    FAMILY HISTORY:  No family history on file.  DRUG ALLERGIES:   Allergies  Allergen Reactions  . Lisinopril Cough    REVIEW OF SYSTEMS:  CONSTITUTIONAL: Fatigue EYES: No blurred or double vision.  EARS, NOSE, AND THROAT: No tinnitus or ear pain.  RESPIRATORY: No cough, shortness of breath, wheezing or hemoptysis.  CARDIOVASCULAR:  No chest pain, orthopnea, edema.  GASTROINTESTINAL: Nausea, vomiting, diarrhea   GENITOURINARY: No dysuria, hematuria.  ENDOCRINE: No polyuria, nocturia,  HEMATOLOGY: No anemia, easy bruising or bleeding SKIN: No rash or lesion. MUSCULOSKELETAL: No joint pain or arthritis.   NEUROLOGIC: No tingling, numbness, weakness.  PSYCHIATRY: No anxiety or depression.   MEDICATIONS AT HOME:   Prior to Admission medications   Medication Sig Start Date End Date Taking? Authorizing Provider  buPROPion (WELLBUTRIN SR) 150 MG 12 hr tablet Take 150 mg by mouth 2 (two) times daily.    [provider]  lisinopril (PRINIVIL,ZESTRIL) 5 MG tablet Take 5 mg by mouth daily.    [provider]  losartan (COZAAR) 50 MG tablet Take 50 mg by mouth daily.    [provider]  rOPINIRole (REQUIP) 0.25 MG tablet Take 0.25 mg by mouth 3 (three) times daily.    [provider]  sildenafil (REVATIO) 20 MG tablet Take 20 mg by mouth 3 (three) times daily.    [provider]  simvastatin (ZOCOR) 40 MG tablet Take 40 mg by mouth daily.    [provider]      VITAL SIGNS:  Blood pressure 121/85, pulse (!) 112, temperature 98.9 F (37.2 C), temperature source Oral, resp. rate (!) 22, height 5\' 10"  (1.778 m), weight 95.3 kg, SpO2 96 %.  PHYSICAL EXAMINATION:  GENERAL:  72 y.o.-year-old patient lying in the bed with no acute distress.  EYES: Pupils equal, round, reactive to lightn. No scleral icterus. Extraocular muscles intact.  HEENT: Head atraumatic, normocephalic. Oropharynx and nasopharynx clear.  NECK:  Supple, no jugular venous distention. No thyroid enlargement, no tenderness.  LUNGS: Normal breath sounds bilaterally, no wheezing, rales,rhonchi or crepitation. No use of accessory muscles of respiration.  CARDIOVASCULAR: S1, S2 normal. No murmurs, rubs, or gallops.  ABDOMEN: Decreased bowel sounds, slight generalized tenderness present, no  organomegaly. EXTREMITIES: No pedal edema, cyanosis, or clubbing.  NEUROLOGIC: Cranial nerves II through XII are intact. Muscle strength 5/5 in all extremities. Sensation intact. Gait not checked.  PSYCHIATRIC: The patient is alert and oriented x 3.  SKIN: No obvious rash, lesion, or ulcer.   LABORATORY PANEL:   CBC Recent Labs  Lab 10/10/18 1237  WBC 7.5  HGB 16.8  HCT 49.7  PLT 254   ------------------------------------------------------------------------------------------------------------------  Chemistries  Recent Labs  Lab 10/10/18 1237  NA 137  K 3.7  CL 96*  CO2 25  GLUCOSE 161*  BUN 50*  CREATININE 2.69*  CALCIUM 9.1  AST 50*  ALT 42  ALKPHOS 50  BILITOT 0.9   ------------------------------------------------------------------------------------------------------------------  Cardiac Enzymes No results for input(s): TROPONINI in the last 168 hours. ------------------------------------------------------------------------------------------------------------------  RADIOLOGY:  Ct Abdomen Pelvis Wo Contrast  Result Date: 10/10/2018 CLINICAL DATA:  Generalized abdominal pain with nausea, vomiting and diarrhea. EXAM: CT ABDOMEN AND PELVIS WITHOUT CONTRAST TECHNIQUE: Multidetector CT imaging of the abdomen and pelvis was performed following the standard protocol without IV contrast. COMPARISON:  None. FINDINGS: Lower chest: No acute abnormality. Hepatobiliary: Tiny cystic areas in the liver likely relate to benign cysts. No gallstones, gallbladder wall thickening, or biliary dilatation. Pancreas: Unremarkable. No pancreatic ductal dilatation or surrounding inflammatory changes. Spleen: Normal in size without focal abnormality. Adrenals/Urinary Tract: Adrenal glands are unremarkable. Kidneys are normal, without renal calculi, focal lesion, or hydronephrosis. Bladder is unremarkable. Stomach/Bowel: Dilated and fluid-filled small bowel loops extend into the pelvis. Maximal  small bowel caliber is approximately 4 cm. There is some relative transition in the pelvis to more normal caliber ileum. The colon contains some fluid and gas. Findings are likely consistent with partial small bowel obstruction or significant ileus. Diverticulosis of the sigmoid colon present without evidence of diverticulitis. No free air. No bowel lesions identified. Vascular/Lymphatic: No significant vascular findings are present. No enlarged abdominal or pelvic lymph nodes. Reproductive: Prostate is unremarkable. Other: Small left inguinal hernia contains fat. No abdominopelvic ascites. Musculoskeletal: Moderate degenerative disc disease throughout the lumbar spine. IMPRESSION: Dilated and fluid-filled small bowel loops extending into the pelvis. Findings are likely consistent with partial small bowel obstruction or significant small bowel ileus. Electronically Signed   By: Aletta Edouard M.D.   On: 10/10/2018 16:05    EKG:   Orders placed or performed during the hospital encounter of 10/10/18  . ED EKG  . ED EKG  . EKG 12-Lead  . EKG 12-Lead    IMPRESSION AND PLAN:   72 year old male with history of restless leg syndrome, essential hypertension, hyperlipidemia comes in because of intractable nausea, vomiting, diarrhea started after he ate a salad at [facility] Tuesday, #1 acute gastroenteritis,: Admit to medical service, continue conservative treatment, start IV fluids, IV nausea medicines, check stool for C. difficile if patient is able to give stool sample.  He says diarrhea stopped. 2.  Acute kidney injury secondary to acute GI illness with nausea, vomiting, diarrhea since 2 days, continue aggressive hydration, monitor renal parameters , hold losartan.  #3 .partial small  bowel obstruction likely due to acute gastroenteritis, spoke to surgeon Dr. Hampton Abbot, repeat x-ray of abdomen tomorrow continue n.p.o., IV fluids, surgery will follow.  No surgical intervention at this time.  Patient is able to  pass flatus. #4 restless leg syndrome: Continue Requip. All the records are reviewed and case discussed with ED provider. Management plans discussed with the patient, family and they are in agreement.  CODE STATUS: Full code  TOTAL TIME TAKING CARE OF THIS PATIENT: 55 minutes.    Epifanio Lesches M.D on 10/10/2018 at 6:01 PM  Between 7am to 6pm - Pager - [phone]  After 6pm go to [email or web address] - password [email or web address] [facility]  [facility]  Office  [phone]  CC: Primary care physician; Clarisse Gouge, MD  Note: This dictation was prepared with Dragon dictation along with smaller phrase technology. Any transcriptional errors that result from this process are unintentional.

## 2018-10-10 NOTE — ED Triage Notes (Signed)
Vomiting and diarrhea, onset Tuesday.  Patient states vomiting persists.

## 2018-10-10 NOTE — ED Provider Notes (Signed)
[facility] Emergency Department Provider Note   ____________________________________________   First MD Initiated Contact with Patient 10/10/18 1531     (approximate)  I have reviewed the triage vital signs and the nursing notes.   HISTORY  Chief Complaint Emesis    HPI Caleb Pena is a 72 y.o. male here for evaluation of vomiting and diarrhea  Patient reports he ate salad on Tuesday started feeling unwell an hour or 2 later and then for the last several days had multiple episodes of severe vomiting.  Has been feeling fatigued, generally weak.  Has not really been able keep anything on his stomach.  Reports a little bit of just crampy discomfort but intermittent episodes of severe vomiting where he vomits up acid.  No black or bloody emesis.  Also frequent loose watery stool.  No travel history.  No recent antibiotic use  No chest pain or trouble breathing  No fever.  Feels very fatigued.  Initially went to urgent care but was sent to the ER for further evaluation.   Past Medical History:  Diagnosis Date  . ED (erectile dysfunction)   . Hypercholesterolemia   . Hypertension   . Smoker     There are no active problems to display for this patient.   Past Surgical History:  Procedure Laterality Date  . BACK SURGERY    . COLONOSCOPY WITH PROPOFOL N/A 05/06/2015   Procedure: COLONOSCOPY WITH PROPOFOL;  Surgeon: Manya Silvas, MD;  Location: [facility] ENDOSCOPY;  Service: Endoscopy;  Laterality: N/A;    Prior to Admission medications   Medication Sig Start Date End Date Taking? Authorizing Provider  buPROPion (WELLBUTRIN SR) 150 MG 12 hr tablet Take 150 mg by mouth 2 (two) times daily.    [provider]  lisinopril (PRINIVIL,ZESTRIL) 5 MG tablet Take 5 mg by mouth daily.    [provider]  losartan (COZAAR) 50 MG tablet Take 50 mg by mouth daily.    [provider]  rOPINIRole (REQUIP) 0.25 MG tablet Take 0.25 mg by  mouth 3 (three) times daily.    [provider]  sildenafil (REVATIO) 20 MG tablet Take 20 mg by mouth 3 (three) times daily.    [provider]  simvastatin (ZOCOR) 40 MG tablet Take 40 mg by mouth daily.    [provider]    Allergies Lisinopril  No family history on file.  Social History Social History   Tobacco Use  . Smoking status: Current Every Day Smoker    Packs/day: 0.50    Types: Cigarettes  . Smokeless tobacco: Former Systems developer    Types: Chew    Quit date: 05/05/1983  Substance Use Topics  . Alcohol use: Not on file  . Drug use: Not on file    Review of Systems  Constitutional: No fever/chills but feeling fatigued Eyes: No visual changes. ENT: No sore throat. Cardiovascular: Denies chest pain. Respiratory: Denies shortness of breath. Gastrointestinal: Abdominal crampiness may be a little more on the left side.  Intermittent.  Frequent vomiting and diarrhea. Genitourinary: Negative for dysuria.  Urine has been a little darker than normal for the last day or 2. Musculoskeletal: Negative for back pain. Skin: Negative for rash. Neurological: Negative for headaches, areas of focal weakness or numbness.    ____________________________________________   PHYSICAL EXAM:  VITAL SIGNS: ED Triage Vitals  Enc Vitals Group     BP 10/10/18 1231 121/85     Pulse Rate 10/10/18 1231 (!) 112  Resp 10/10/18 1231 (!) 22     Temp 10/10/18 1231 98.9 F (37.2 C)     Temp Source 10/10/18 1231 Oral     SpO2 10/10/18 1231 96 %     Weight 10/10/18 1232 210 lb (95.3 kg)     Height 10/10/18 1232 5\' 10"  (1.778 m)     Head Circumference --      Peak Flow --      Pain Score --      Pain Loc --      Pain Edu? --      Excl. in [place]? --     Constitutional: Alert and oriented.  Moderately ill-appearing but in no acute distress.  He and his fiance are both very pleasant. Eyes: Conjunctivae are normal. Head: Atraumatic. Nose: No  congestion/rhinnorhea. Mouth/Throat: Mucous membranes are dry. Neck: No stridor.  Cardiovascular: Normal rate, regular rhythm. Grossly normal heart sounds.  Good peripheral circulation. Respiratory: Normal respiratory effort.  No retractions. Lungs CTAB. Gastrointestinal: Soft and nontender sit for a little bit of what he describes as a slight discomfort to palpation over the left side of the abdomen may be more so slightly in the left lower. No distention. Musculoskeletal: No lower extremity tenderness nor edema. Neurologic:  Normal speech and language. No gross focal neurologic deficits are appreciated.  Skin:  Skin is warm, dry and intact. No rash noted. Psychiatric: Mood and affect are normal. Speech and behavior are normal.  ____________________________________________   LABS (all labs ordered are listed, but only abnormal results are displayed)  Labs Reviewed  COMPREHENSIVE METABOLIC PANEL - Abnormal; Notable for the following components:      Result Value   Chloride 96 (*)    Glucose, Bld 161 (*)    BUN 50 (*)    Creatinine, Ser 2.69 (*)    Total Protein 8.2 (*)    AST 50 (*)    GFR calc non Af Amer 23 (*)    GFR calc Af Amer 27 (*)    Anion gap 16 (*)    All other components within normal limits  CK - Abnormal; Notable for the following components:   Total CK 592 (*)    All other components within normal limits  GASTROINTESTINAL PANEL BY PCR, STOOL (REPLACES STOOL CULTURE)  C DIFFICILE QUICK SCREEN W PCR REFLEX  LIPASE, BLOOD  CBC  INFLUENZA PANEL BY PCR (TYPE A & B)  URINALYSIS, COMPLETE (UACMP) WITH MICROSCOPIC   ____________________________________________  EKG  Reviewed and interpreted by me at 1700 Heart rate 85 QRS 80 QTc 430 PR interval normal Normal sinus rhythm no evidence of acute ischemia ____________________________________________  RADIOLOGY  Ct Abdomen Pelvis Wo Contrast  Result Date: 10/10/2018 CLINICAL DATA:  Generalized abdominal pain  with nausea, vomiting and diarrhea. EXAM: CT ABDOMEN AND PELVIS WITHOUT CONTRAST TECHNIQUE: Multidetector CT imaging of the abdomen and pelvis was performed following the standard protocol without IV contrast. COMPARISON:  None. FINDINGS: Lower chest: No acute abnormality. Hepatobiliary: Tiny cystic areas in the liver likely relate to benign cysts. No gallstones, gallbladder wall thickening, or biliary dilatation. Pancreas: Unremarkable. No pancreatic ductal dilatation or surrounding inflammatory changes. Spleen: Normal in size without focal abnormality. Adrenals/Urinary Tract: Adrenal glands are unremarkable. Kidneys are normal, without renal calculi, focal lesion, or hydronephrosis. Bladder is unremarkable. Stomach/Bowel: Dilated and fluid-filled small bowel loops extend into the pelvis. Maximal small bowel caliber is approximately 4 cm. There is some relative transition in the pelvis to more normal caliber ileum. The  colon contains some fluid and gas. Findings are likely consistent with partial small bowel obstruction or significant ileus. Diverticulosis of the sigmoid colon present without evidence of diverticulitis. No free air. No bowel lesions identified. Vascular/Lymphatic: No significant vascular findings are present. No enlarged abdominal or pelvic lymph nodes. Reproductive: Prostate is unremarkable. Other: Small left inguinal hernia contains fat. No abdominopelvic ascites. Musculoskeletal: Moderate degenerative disc disease throughout the lumbar spine. IMPRESSION: Dilated and fluid-filled small bowel loops extending into the pelvis. Findings are likely consistent with partial small bowel obstruction or significant small bowel ileus. Electronically Signed   By: Aletta Edouard M.D.   On: 10/10/2018 16:05      CT scan results reviewed and discussed with Dr. Hampton Abbot   ____________________________________________   PROCEDURES  Procedure(s) performed: None  Procedures  Critical Care performed:  No  ____________________________________________   INITIAL IMPRESSION / ASSESSMENT AND PLAN / ED COURSE  Pertinent labs & imaging results that were available during my care of the patient were reviewed by me and considered in my medical decision making (see chart for details).   Patient presents for nausea vomiting diarrhea with associated crampy abdominal pain.  He did appears quite dehydrated and his lab work indicates acute kidney injury.  Due to his symptoms and seasonality we will check influenza, stool cultures, IV hydration, CT without contrast to further evaluate for possible diverticulitis or other acute intra-abdominal pathology or infection.  Some limitation as we cannot give IV contrast given his reduced GFR at this time.  Generous IV hydration.  Discussed with patient and his fiance, understanding of his need for admission due to his acute kidney injury.  Suspect likely prerenal given his history.  No associated cardiac or pulmonary symptoms.    Clinical Course as of Oct 10 1757  Thu Oct 10, 2018  1633 CT reviewed. Consult placed with Dr. Hampton Abbot. Patient continues to hiccup but no vomiting.    [MQ]  24 Dr. Hampton Abbot reviewed CT imaging. Advises most likely ileus or infectious etiology. Advises non-operative but will see in consult. Recommends admit to medicine and will see in consult.    [MQ]    Clinical Course User Index [MQ] Delman Kitten, MD   Admission discussed with hospitalist, Dr. Vianne Bulls.  Dr. Hampton Abbot will provide consultation.  ____________________________________________   FINAL CLINICAL IMPRESSION(S) / ED DIAGNOSES  Final diagnoses:  Dehydration  Nausea vomiting and diarrhea  AKI (acute kidney injury) ([place])  Enteritis        Note:  This document was prepared using Dragon voice recognition software and may include unintentional dictation errors       Delman Kitten, MD 10/10/18 1759

## 2018-10-11 ENCOUNTER — Inpatient Hospital Stay: Payer: Medicare Other

## 2018-10-11 LAB — BASIC METABOLIC PANEL
Anion gap: 8 (ref 5–15)
BUN: 48 mg/dL — ABNORMAL HIGH (ref 8–23)
CO2: 28 mmol/L (ref 22–32)
Calcium: 8.1 mg/dL — ABNORMAL LOW (ref 8.9–10.3)
Chloride: 106 mmol/L (ref 98–111)
Creatinine, Ser: 1.68 mg/dL — ABNORMAL HIGH (ref 0.61–1.24)
GFR calc Af Amer: 47 mL/min — ABNORMAL LOW (ref >60)
GFR calc non Af Amer: 41 mL/min — ABNORMAL LOW (ref >60)
Glucose, Bld: 107 mg/dL — ABNORMAL HIGH (ref 70–99)
Potassium: 3.6 mmol/L (ref 3.5–5.1)
Sodium: 142 mmol/L (ref 135–145)

## 2018-10-11 LAB — CBC
HCT: 42.2 % (ref 39.0–52.0)
Hemoglobin: 13.7 g/dL (ref 13.0–17.0)
MCH: 29.5 pg (ref 26.0–34.0)
MCHC: 32.5 g/dL (ref 30.0–36.0)
MCV: 90.9 fL (ref 80.0–100.0)
NRBC: 0 % (ref 0.0–0.2)
PLATELETS: 205 10*3/uL (ref 150–400)
RBC: 4.64 MIL/uL (ref 4.22–5.81)
RDW: 13.2 % (ref 11.5–15.5)
WBC: 5.5 10*3/uL (ref 4.0–10.5)

## 2018-10-11 LAB — C DIFFICILE QUICK SCREEN W PCR REFLEX
C Diff antigen: NEGATIVE
C Diff interpretation: NOT DETECTED
C Diff toxin: NEGATIVE

## 2018-10-11 LAB — GLUCOSE, CAPILLARY: Glucose-Capillary: 92 mg/dL (ref 70–99)

## 2018-10-11 MED ORDER — PANTOPRAZOLE SODIUM 40 MG PO TBEC
40.0000 mg | DELAYED_RELEASE_TABLET | Freq: Every day | ORAL | Status: DC
  Administered 2018-10-12: 40 mg via ORAL
  Filled 2018-10-11: qty 1

## 2018-10-11 NOTE — Progress Notes (Signed)
10/11/2018  Subjective: No acute events.  Patient had two more BMs yesterday and tested positive for Norovirus.  Reports hiccups but no real nausea or emesis.    Vital signs: Temp:  [98.1 F (36.7 C)-99 F (37.2 C)] 98.1 F (36.7 C) (12/27 0345) Pulse Rate:  [75-112] 75 (12/27 0345) Resp:  [13-22] 16 (12/27 0345) BP: (120-139)/(71-85) 126/71 (12/27 0345) SpO2:  [94 %-97 %] 96 % (12/27 0345) Weight:  [90.8 kg-95.3 kg] 90.8 kg (12/27 0500)   Intake/Output: 12/26 0701 - 12/27 0700 In: 2227.5 [I.V.:727.5; IV Piggyback:1500] Out: 200 [Urine:200] Last BM Date: 10/10/18  Physical Exam: Constitutional: No acute distress Abdomen:  Soft, nondistended, nontender to palpation.  Labs:  Recent Labs    10/10/18 1237 10/11/18 0416  WBC 7.5 5.5  HGB 16.8 13.7  HCT 49.7 42.2  PLT 254 205   Recent Labs    10/10/18 1237 10/11/18 0416  NA 137 142  K 3.7 3.6  CL 96* 106  CO2 25 28  GLUCOSE 161* 107*  BUN 50* 48*  CREATININE 2.69* 1.68*  CALCIUM 9.1 8.1*   No results for input(s): LABPROT, INR in the last 72 hours.  Imaging: Ct Abdomen Pelvis Wo Contrast  Result Date: 10/10/2018 CLINICAL DATA:  Generalized abdominal pain with nausea, vomiting and diarrhea. EXAM: CT ABDOMEN AND PELVIS WITHOUT CONTRAST TECHNIQUE: Multidetector CT imaging of the abdomen and pelvis was performed following the standard protocol without IV contrast. COMPARISON:  None. FINDINGS: Lower chest: No acute abnormality. Hepatobiliary: Tiny cystic areas in the liver likely relate to benign cysts. No gallstones, gallbladder wall thickening, or biliary dilatation. Pancreas: Unremarkable. No pancreatic ductal dilatation or surrounding inflammatory changes. Spleen: Normal in size without focal abnormality. Adrenals/Urinary Tract: Adrenal glands are unremarkable. Kidneys are normal, without renal calculi, focal lesion, or hydronephrosis. Bladder is unremarkable. Stomach/Bowel: Dilated and fluid-filled small bowel loops  extend into the pelvis. Maximal small bowel caliber is approximately 4 cm. There is some relative transition in the pelvis to more normal caliber ileum. The colon contains some fluid and gas. Findings are likely consistent with partial small bowel obstruction or significant ileus. Diverticulosis of the sigmoid colon present without evidence of diverticulitis. No free air. No bowel lesions identified. Vascular/Lymphatic: No significant vascular findings are present. No enlarged abdominal or pelvic lymph nodes. Reproductive: Prostate is unremarkable. Other: Small left inguinal hernia contains fat. No abdominopelvic ascites. Musculoskeletal: Moderate degenerative disc disease throughout the lumbar spine. IMPRESSION: Dilated and fluid-filled small bowel loops extending into the pelvis. Findings are likely consistent with partial small bowel obstruction or significant small bowel ileus. Electronically Signed   By: Aletta Edouard M.D.   On: 10/10/2018 16:05   Dg Abd 1 View  Result Date: 10/11/2018 CLINICAL DATA:  Left upper quadrant pain.  Nausea vomiting. EXAM: ABDOMEN - 1 VIEW COMPARISON:  CT, 10/10/2018. FINDINGS: There are dilated loops of small bowel most evident in the left mid abdomen. Air is seen within a normal caliber colon. Findings are consistent with a partial small bowel obstruction as noted on the previous day's CT scan. Soft tissues are unremarkable.  No acute skeletal abnormality. IMPRESSION: 1. Dilated small bowel consistent with a partial small bowel obstruction, without significant change compared to the previous day's CT scan. Electronically Signed   By: Lajean Manes M.D.   On: 10/11/2018 07:11    Assessment/Plan: This is a 72 y.o. male with gastroenteritis.  --Will start patient on clear liquid diet as he's feeling better.  Acute kidney injury  has improved today as well. --Advance diet as tolerated.  Will sign off for now but feel free to message or page if any questions or  concerns.   Melvyn Neth, [facility]

## 2018-10-11 NOTE — Progress Notes (Addendum)
[place] [facility] [place] NAME: Caleb Pena    MR#:  [id number]  DATE OF BIRTH:  [birth year]  SUBJECTIVE:  CHIEF COMPLAINT:   Chief Complaint  Patient presents with  . Emesis   -Came in after eating salad outside and presented with nausea vomiting and diarrhea -Symptoms are improving  REVIEW OF SYSTEMS:  Review of Systems  Constitutional: Negative for chills, fever and malaise/fatigue.  HENT: Negative for congestion, ear discharge, hearing loss and nosebleeds.   Eyes: Negative for blurred vision and double vision.  Respiratory: Negative for cough, shortness of breath and wheezing.   Cardiovascular: Negative for chest pain, palpitations and leg swelling.  Gastrointestinal: Positive for abdominal pain and nausea. Negative for constipation, diarrhea and vomiting.  Genitourinary: Negative for dysuria.  Musculoskeletal: Negative for myalgias.  Neurological: Negative for dizziness, focal weakness, seizures, weakness and headaches.  Psychiatric/Behavioral: Negative for depression.    DRUG ALLERGIES:   Allergies  Allergen Reactions  . Lisinopril Cough    VITALS:  Blood pressure 126/71, pulse 75, temperature 98.1 F (36.7 C), temperature source Oral, resp. rate 16, height 5\' 10"  (1.778 m), weight 90.8 kg, SpO2 96 %.  PHYSICAL EXAMINATION:  Physical Exam   GENERAL:  72 y.o.-year-old patient lying in the bed with no acute distress.  EYES: Pupils equal, round, reactive to light and accommodation. No scleral icterus. Extraocular muscles intact.  HEENT: Head atraumatic, normocephalic. Oropharynx and nasopharynx clear.  NECK:  Supple, no jugular venous distention. No thyroid enlargement, no tenderness.  LUNGS: Normal breath sounds bilaterally, no wheezing, rales,rhonchi or crepitation. No use of accessory muscles of respiration.  CARDIOVASCULAR: S1, S2 normal. No murmurs, rubs, or gallops.  ABDOMEN: Soft, minimal tenderness in bilateral lower  quadrants, nondistended.  Hypoactive bowel sounds present. No organomegaly or mass.  EXTREMITIES: No pedal edema, cyanosis, or clubbing.  NEUROLOGIC: Cranial nerves II through XII are intact. Muscle strength 5/5 in all extremities. Sensation intact. Gait not checked.  PSYCHIATRIC: The patient is alert and oriented x 3.  SKIN: No obvious rash, lesion, or ulcer.    LABORATORY PANEL:   CBC Recent Labs  Lab 10/11/18 0416  WBC 5.5  HGB 13.7  HCT 42.2  PLT 205   ------------------------------------------------------------------------------------------------------------------  Chemistries  Recent Labs  Lab 10/10/18 1237 10/11/18 0416  NA 137 142  K 3.7 3.6  CL 96* 106  CO2 25 28  GLUCOSE 161* 107*  BUN 50* 48*  CREATININE 2.69* 1.68*  CALCIUM 9.1 8.1*  AST 50*  --   ALT 42  --   ALKPHOS 50  --   BILITOT 0.9  --    ------------------------------------------------------------------------------------------------------------------  Cardiac Enzymes No results for input(s): TROPONINI in the last 168 hours. ------------------------------------------------------------------------------------------------------------------  RADIOLOGY:  Ct Abdomen Pelvis Wo Contrast  Result Date: 10/10/2018 CLINICAL DATA:  Generalized abdominal pain with nausea, vomiting and diarrhea. EXAM: CT ABDOMEN AND PELVIS WITHOUT CONTRAST TECHNIQUE: Multidetector CT imaging of the abdomen and pelvis was performed following the standard protocol without IV contrast. COMPARISON:  None. FINDINGS: Lower chest: No acute abnormality. Hepatobiliary: Tiny cystic areas in the liver likely relate to benign cysts. No gallstones, gallbladder wall thickening, or biliary dilatation. Pancreas: Unremarkable. No pancreatic ductal dilatation or surrounding inflammatory changes. Spleen: Normal in size without focal abnormality. Adrenals/Urinary Tract: Adrenal glands are unremarkable. Kidneys are normal, without renal calculi, focal  lesion, or hydronephrosis. Bladder is unremarkable. Stomach/Bowel: Dilated and fluid-filled small bowel loops extend into the pelvis. Maximal small bowel  caliber is approximately 4 cm. There is some relative transition in the pelvis to more normal caliber ileum. The colon contains some fluid and gas. Findings are likely consistent with partial small bowel obstruction or significant ileus. Diverticulosis of the sigmoid colon present without evidence of diverticulitis. No free air. No bowel lesions identified. Vascular/Lymphatic: No significant vascular findings are present. No enlarged abdominal or pelvic lymph nodes. Reproductive: Prostate is unremarkable. Other: Small left inguinal hernia contains fat. No abdominopelvic ascites. Musculoskeletal: Moderate degenerative disc disease throughout the lumbar spine. IMPRESSION: Dilated and fluid-filled small bowel loops extending into the pelvis. Findings are likely consistent with partial small bowel obstruction or significant small bowel ileus. Electronically Signed   By: Aletta Edouard M.D.   On: 10/10/2018 16:05   Dg Abd 1 View  Result Date: 10/11/2018 CLINICAL DATA:  Left upper quadrant pain.  Nausea vomiting. EXAM: ABDOMEN - 1 VIEW COMPARISON:  CT, 10/10/2018. FINDINGS: There are dilated loops of small bowel most evident in the left mid abdomen. Air is seen within a normal caliber colon. Findings are consistent with a partial small bowel obstruction as noted on the previous day's CT scan. Soft tissues are unremarkable.  No acute skeletal abnormality. IMPRESSION: 1. Dilated small bowel consistent with a partial small bowel obstruction, without significant change compared to the previous day's CT scan. Electronically Signed   By: Lajean Manes M.D.   On: 10/11/2018 07:11    EKG:   Orders placed or performed during the hospital encounter of 10/10/18  . EKG 12-Lead  . EKG 12-Lead    ASSESSMENT AND PLAN:   72 year old male with past medical history  significant for hypertension, restless leg syndrome, tobacco abuse comes with abdominal pain, nausea and vomiting  1.  Acute gastroenteritis-secondary to norovirus after eating outside food -CT with partial small bowel obstruction -Patient is able to pass flatus.  No complete obstruction noted.  Appreciate general surgery consult -Symptoms are improving.  Started on clear liquid diet.  Advance as tolerated.  2.  Depression and anxiety-continue Wellbutrin  3.  Restless leg syndrome-on Requip  4.  GERD-Protonix  5.  DVT prophylaxis-subcutaneous heparin  6.  Acute renal failure-prerenal causes.  Receiving IV fluids with some improvement  Encourage ambulation     All the records are reviewed and case discussed with Care Management/Social Workerr. Management plans discussed with the patient, family and they are in agreement.  CODE STATUS: Full Code  TOTAL TIME TAKING CARE OF THIS PATIENT: 38 minutes.   POSSIBLE D/C IN 2-3 DAYS, DEPENDING ON CLINICAL CONDITION.   Gladstone Lighter M.D on 10/11/2018 at 1:22 PM  Between 7am to 6pm - Pager - [phone]  After 6pm go to [email or web address] - password [email or web address] [facility]  Office  [phone]  CC: Primary care physician; Clarisse Gouge, MD

## 2018-10-12 ENCOUNTER — Inpatient Hospital Stay: Payer: Medicare Other

## 2018-10-12 LAB — BASIC METABOLIC PANEL
Anion gap: 9 (ref 5–15)
BUN: 25 mg/dL — AB (ref 8–23)
CO2: 23 mmol/L (ref 22–32)
CREATININE: 1.18 mg/dL (ref 0.61–1.24)
Calcium: 8.3 mg/dL — ABNORMAL LOW (ref 8.9–10.3)
Chloride: 106 mmol/L (ref 98–111)
GFR calc Af Amer: >60 mL/min (ref >60)
GFR calc non Af Amer: >60 mL/min (ref >60)
Glucose, Bld: 104 mg/dL — ABNORMAL HIGH (ref 70–99)
POTASSIUM: 3.3 mmol/L — AB (ref 3.5–5.1)
Sodium: 138 mmol/L (ref 135–145)

## 2018-10-12 LAB — GLUCOSE, CAPILLARY: Glucose-Capillary: 90 mg/dL (ref 70–99)

## 2018-10-12 LAB — HIV ANTIBODY (ROUTINE TESTING W REFLEX): HIV Screen 4th Generation wRfx: NONREACTIVE

## 2018-10-12 MED ORDER — POTASSIUM CHLORIDE CRYS ER 20 MEQ PO TBCR
40.0000 meq | EXTENDED_RELEASE_TABLET | Freq: Once | ORAL | Status: AC
  Administered 2018-10-12: 40 meq via ORAL
  Filled 2018-10-12: qty 2

## 2018-10-12 MED ORDER — SIMETHICONE 80 MG PO CHEW
160.0000 mg | CHEWABLE_TABLET | Freq: Three times a day (TID) | ORAL | Status: DC
  Administered 2018-10-12: 160 mg via ORAL
  Filled 2018-10-12 (×3): qty 2

## 2018-10-12 NOTE — Progress Notes (Signed)
10/12/2018 3:46 PM  Caleb Pena to be D/C'd Home per MD order.  Discussed prescriptions and follow up appointments with the patient. Prescriptions given to patient, medication list explained in detail. Pt verbalized understanding.  Allergies as of 10/12/2018      Reactions   Lisinopril Cough      Medication List    STOP taking these medications   lisinopril 5 MG tablet Commonly known as:  PRINIVIL,ZESTRIL     TAKE these medications   buPROPion 150 MG 12 hr tablet Commonly known as:  WELLBUTRIN SR Take 150 mg by mouth 2 (two) times daily.   fenofibrate 54 MG tablet Take 1 tablet by mouth daily.   ketoconazole 2 % shampoo Commonly known as:  NIZORAL Apply 1 application topically 3 (three) times a week.   losartan 50 MG tablet Commonly known as:  COZAAR Take 50 mg by mouth daily.   metroNIDAZOLE 0.75 % gel Commonly known as:  METROGEL Apply 1 application topically daily.   mometasone 0.1 % lotion Commonly known as:  ELOCON Apply 1 application topically 3 (three) times a week.   rOPINIRole 0.25 MG tablet Commonly known as:  REQUIP Take 0.25 mg by mouth 3 (three) times daily.   rOPINIRole 0.5 MG tablet Commonly known as:  REQUIP Take 1 tablet by mouth at bedtime.   sildenafil 20 MG tablet Commonly known as:  REVATIO Take 20 mg by mouth 3 (three) times daily.   simvastatin 40 MG tablet Commonly known as:  ZOCOR Take 40 mg by mouth daily.       Vitals:   10/11/18 2011 10/12/18 0531  BP: 110/71 108/70  Pulse: 68 64  Resp: 20 20  Temp: 98.9 F (37.2 C) 98.1 F (36.7 C)  SpO2: 98% 96%    Skin clean, dry and intact without evidence of skin break down, no evidence of skin tears noted. IV catheter discontinued intact. Site without signs and symptoms of complications. Dressing and pressure applied. Pt denies pain at this time. No complaints noted.  An After Visit Summary was printed and given to the patient. Patient escorted via [place], and D/C home via  private auto.  Dola Argyle

## 2018-10-13 NOTE — Discharge Summary (Signed)
[facility] [place] [facility] [place] NAME: Caleb Pena    MR#:  [id number]  DATE OF BIRTH:  [birth year]  DATE OF ADMISSION:  10/10/2018   ADMITTING PHYSICIAN: Epifanio Lesches, MD  DATE OF DISCHARGE: 10/12/2018  4:00 PM  PRIMARY CARE PHYSICIAN: Clarisse Gouge, MD   ADMISSION DIAGNOSIS:   Dehydration [E86.0] Enteritis [K52.9] AKI (acute kidney injury) ([place]) [N17.9] Nausea vomiting and diarrhea [R11.2, R19.7] Abdominal pain [R10.9]  DISCHARGE DIAGNOSIS:   Active Problems:   Acute kidney injury ([place])   SECONDARY DIAGNOSIS:   Past Medical History:  Diagnosis Date  . ED (erectile dysfunction)   . Hypercholesterolemia   . Hypertension   . Smoker     HOSPITAL COURSE:   72 year old male with past medical history significant for hypertension, restless leg syndrome, tobacco abuse comes with abdominal pain, nausea and vomiting  1.  Acute gastroenteritis-secondary to norovirus after eating outside food -CT with partial small bowel obstruction on admission -Patient was started on IV fluids and diet was slowly advanced.  No further nausea or vomiting.  Had a couple of loose stools which improved. -Follow-up KUB with resolution of small bowel obstruction -Patient was able to tolerate solid diet prior to discharge.  2.  Depression and anxiety-continue Wellbutrin  3.  Restless leg syndrome-on Requip  4.  Acute renal failure on admission-secondary to prerenal causes and GI losses.  Improved with IV fluids.  Renal function normalized.  5.    Hypertension-continue losartan   Patient has been up and ambulatory while in the hospital.  Will be discharged today  Encourage ambulation  DISCHARGE CONDITIONS:   Guarded CONSULTS OBTAINED:   Surgery consult  DRUG ALLERGIES:   Allergies  Allergen Reactions  . Lisinopril Cough   DISCHARGE MEDICATIONS:   Allergies as of 10/12/2018      Reactions   Lisinopril Cough        Medication List    STOP taking these medications   lisinopril 5 MG tablet Commonly known as:  PRINIVIL,ZESTRIL     TAKE these medications   buPROPion 150 MG 12 hr tablet Commonly known as:  WELLBUTRIN SR Take 150 mg by mouth 2 (two) times daily.   fenofibrate 54 MG tablet Take 1 tablet by mouth daily.   ketoconazole 2 % shampoo Commonly known as:  NIZORAL Apply 1 application topically 3 (three) times a week.   losartan 50 MG tablet Commonly known as:  COZAAR Take 50 mg by mouth daily.   metroNIDAZOLE 0.75 % gel Commonly known as:  METROGEL Apply 1 application topically daily.   mometasone 0.1 % lotion Commonly known as:  ELOCON Apply 1 application topically 3 (three) times a week.   rOPINIRole 0.25 MG tablet Commonly known as:  REQUIP Take 0.25 mg by mouth 3 (three) times daily.   rOPINIRole 0.5 MG tablet Commonly known as:  REQUIP Take 1 tablet by mouth at bedtime.   sildenafil 20 MG tablet Commonly known as:  REVATIO Take 20 mg by mouth 3 (three) times daily.   simvastatin 40 MG tablet Commonly known as:  ZOCOR Take 40 mg by mouth daily.        DISCHARGE INSTRUCTIONS:   1.  PCP follow-up in 1 to 2 weeks  DIET:   Cardiac diet  ACTIVITY:   Activity as tolerated  OXYGEN:   Home Oxygen: No.  Oxygen Delivery: room air  DISCHARGE LOCATION:   home   If you experience worsening of your  admission symptoms, develop shortness of breath, life threatening emergency, suicidal or homicidal thoughts you must seek medical attention immediately by calling 911 or calling your MD immediately  if symptoms less severe.  You Must read complete instructions/literature along with all the possible adverse reactions/side effects for all the Medicines you take and that have been prescribed to you. Take any new Medicines after you have completely understood and accpet all the possible adverse reactions/side effects.   Please note  You were cared for by a  hospitalist during your hospital stay. If you have any questions about your discharge medications or the care you received while you were in the hospital after you are discharged, you can call the unit and asked to speak with the hospitalist on call if the hospitalist that took care of you is not available. Once you are discharged, your primary care physician will handle any further medical issues. Please note that NO REFILLS for any discharge medications will be authorized once you are discharged, as it is imperative that you return to your primary care physician (or establish a relationship with a primary care physician if you do not have one) for your aftercare needs so that they can reassess your need for medications and monitor your lab values.    On the day of Discharge:  VITAL SIGNS:   Blood pressure 108/70, pulse 64, temperature 98.1 F (36.7 C), temperature source Oral, resp. rate 20, height 5\' 10"  (1.778 m), weight 93.6 kg, SpO2 96 %.  PHYSICAL EXAMINATION:    GENERAL:  72 y.o.-year-old patient lying in the bed with no acute distress.  EYES: Pupils equal, round, reactive to light and accommodation. No scleral icterus. Extraocular muscles intact.  HEENT: Head atraumatic, normocephalic. Oropharynx and nasopharynx clear.  NECK:  Supple, no jugular venous distention. No thyroid enlargement, no tenderness.  LUNGS: Normal breath sounds bilaterally, no wheezing, rales,rhonchi or crepitation. No use of accessory muscles of respiration.  CARDIOVASCULAR: S1, S2 normal. No murmurs, rubs, or gallops.  ABDOMEN: Soft, minimal tenderness in bilateral lower quadrants, nondistended.  normal bowel sounds present. No organomegaly or mass.  EXTREMITIES: No pedal edema, cyanosis, or clubbing.  NEUROLOGIC: Cranial nerves II through XII are intact. Muscle strength 5/5 in all extremities. Sensation intact. Gait not checked.  PSYCHIATRIC: The patient is alert and oriented x 3.  SKIN: No obvious rash, lesion,  or ulcer.    DATA REVIEW:   CBC Recent Labs  Lab 10/11/18 0416  WBC 5.5  HGB 13.7  HCT 42.2  PLT 205    Chemistries  Recent Labs  Lab 10/10/18 1237  10/12/18 0604  NA 137   < > 138  K 3.7   < > 3.3*  CL 96*   < > 106  CO2 25   < > 23  GLUCOSE 161*   < > 104*  BUN 50*   < > 25*  CREATININE 2.69*   < > 1.18  CALCIUM 9.1   < > 8.3*  AST 50*  --   --   ALT 42  --   --   ALKPHOS 50  --   --   BILITOT 0.9  --   --    < > = values in this interval not displayed.     Microbiology Results  Results for orders placed or performed during the hospital encounter of 10/10/18  Gastrointestinal Panel by PCR , Stool     Status: Abnormal   Collection Time: 10/10/18  9:06 PM  Result Value Ref Range Status   Campylobacter species NOT DETECTED NOT DETECTED Final   Plesimonas shigelloides NOT DETECTED NOT DETECTED Final   Salmonella species NOT DETECTED NOT DETECTED Final   Yersinia enterocolitica NOT DETECTED NOT DETECTED Final   Vibrio species NOT DETECTED NOT DETECTED Final   Vibrio cholerae NOT DETECTED NOT DETECTED Final   Enteroaggregative E coli (EAEC) NOT DETECTED NOT DETECTED Final   Enteropathogenic E coli (EPEC) NOT DETECTED NOT DETECTED Final   Enterotoxigenic E coli (ETEC) NOT DETECTED NOT DETECTED Final   Shiga like toxin producing E coli (STEC) NOT DETECTED NOT DETECTED Final   Shigella/Enteroinvasive E coli (EIEC) NOT DETECTED NOT DETECTED Final   Cryptosporidium NOT DETECTED NOT DETECTED Final   Cyclospora cayetanensis NOT DETECTED NOT DETECTED Final   Entamoeba histolytica NOT DETECTED NOT DETECTED Final   Giardia lamblia NOT DETECTED NOT DETECTED Final   Adenovirus F40/41 NOT DETECTED NOT DETECTED Final   Astrovirus NOT DETECTED NOT DETECTED Final   Norovirus GI/GII DETECTED (A) NOT DETECTED Final    Comment: RESULT CALLED TO, READ BACK BY AND VERIFIED WITH: RN KIM @2332  10/10/18 AKT    Rotavirus A NOT DETECTED NOT DETECTED Final   Sapovirus (I, II, IV, and  V) NOT DETECTED NOT DETECTED Final    Comment: Performed at [facility], [address] [place], [place] [address]  C difficile quick scan w PCR reflex     Status: None   Collection Time: 10/10/18  9:06 PM  Result Value Ref Range Status   C Diff antigen NEGATIVE NEGATIVE Final   C Diff toxin NEGATIVE NEGATIVE Final   C Diff interpretation No C. difficile detected.  Final    Comment: Performed at [facility], [address] [place], [place] [address]    RADIOLOGY:  Bea Graff 1 View  Result Date: 10/12/2018 CLINICAL DATA:  Small bowel obstruction. EXAM: ABDOMEN - 1 VIEW COMPARISON:  Radiographs of October 11, 2018. FINDINGS: Decreased small bowel dilatation is noted suggesting improving obstruction or ileus. Phleboliths are noted in the pelvis. No colonic dilatation is noted. IMPRESSION: Improving small bowel obstruction or ileus. Electronically Signed   By: Marijo Conception, M.D.   On: 10/12/2018 14:11     Management plans discussed with the patient, family and they are in agreement.  CODE STATUS:  Code Status History    Date Active Date Inactive Code Status Order ID Comments User Context   10/10/2018 1800 10/12/2018 1906 Full Code [id number]  Epifanio Lesches, MD ED    Advance Directive Documentation     Most Recent Value  Type of Advance Directive  Healthcare Power of Attorney, Living will  Pre-existing out of facility DNR order (yellow form or pink MOST form)  -  "MOST" Form in Place?  -      TOTAL TIME TAKING CARE OF THIS PATIENT: 38 minutes.    Gladstone Lighter M.D on 10/13/2018 at 11:42 AM  Between 7am to 6pm - Pager - [phone]  After 6pm go to [email or web address] - [employer] [facility]  Office  [phone]  CC: Primary care physician; Clarisse Gouge, MD   Note: This dictation was prepared with Dragon dictation along with smaller phrase technology. Any transcriptional errors that result from this  process are unintentional.

## 2018-10-15 ENCOUNTER — Telehealth: Payer: Self-pay

## 2018-10-15 NOTE — Telephone Encounter (Signed)
Received call from patient stating he was returning a call from [place] that he missed.  He was recently discharged.  Explained the EMMI callback process and that automated calls would go out day 2 and day 4 after discharge.  Patient has no concerns and reports he was able to fill his prescriptions and has a follow up appointment. Stated he would not be present for the second call as he was going to the beach later today.

## 2019-09-24 DIAGNOSIS — D239 Other benign neoplasm of skin, unspecified: Secondary | ICD-10-CM

## 2019-09-24 HISTORY — DX: Other benign neoplasm of skin, unspecified: D23.9

## 2020-01-18 ENCOUNTER — Other Ambulatory Visit: Payer: Self-pay

## 2020-01-18 ENCOUNTER — Encounter: Payer: Self-pay | Admitting: Emergency Medicine

## 2020-01-18 ENCOUNTER — Ambulatory Visit: Payer: Medicare Other

## 2020-01-18 ENCOUNTER — Ambulatory Visit
Admission: EM | Admit: 2020-01-18 | Discharge: 2020-01-18 | Disposition: A | Discharge status: Home / Self Care | Payer: Medicare Other | Attending: Emergency Medicine | Admitting: Emergency Medicine

## 2020-01-18 DIAGNOSIS — S161XXA Strain of muscle, fascia and tendon at neck level, initial encounter: Secondary | ICD-10-CM | POA: Insufficient documentation

## 2020-01-18 DIAGNOSIS — I10 Essential (primary) hypertension: Secondary | ICD-10-CM | POA: Diagnosis not present

## 2020-01-18 DIAGNOSIS — Z87891 Personal history of nicotine dependence: Secondary | ICD-10-CM | POA: Insufficient documentation

## 2020-01-18 DIAGNOSIS — Z79899 Other long term (current) drug therapy: Secondary | ICD-10-CM | POA: Diagnosis not present

## 2020-01-18 DIAGNOSIS — M542 Cervicalgia: Secondary | ICD-10-CM | POA: Diagnosis present

## 2020-01-18 DIAGNOSIS — S0081XA Abrasion of other part of head, initial encounter: Secondary | ICD-10-CM | POA: Insufficient documentation

## 2020-01-18 DIAGNOSIS — W19XXXA Unspecified fall, initial encounter: Secondary | ICD-10-CM | POA: Diagnosis not present

## 2020-01-18 MED ORDER — MUPIROCIN 2 % EX OINT: 1.0000 "application " | TOPICAL_OINTMENT | Freq: Three times a day (TID) | CUTANEOUS | 0 refills | Status: AC

## 2020-01-18 MED ORDER — TIZANIDINE HCL 4 MG PO TABS: 4.0000 mg | ORAL_TABLET | Freq: Three times a day (TID) | ORAL | 0 refills | Status: DC | PRN

## 2020-01-18 MED ORDER — NAPROXEN 375 MG PO TABS: 375.0000 mg | ORAL_TABLET | Freq: Two times a day (BID) | ORAL | 0 refills | Status: AC

## 2020-01-18 MED ORDER — TIZANIDINE HCL 2 MG PO TABS: 2.0000 mg | ORAL_TABLET | Freq: Three times a day (TID) | ORAL | 0 refills | Status: DC | PRN

## 2020-01-18 NOTE — ED Provider Notes (Signed)
HPI  SUBJECTIVE:  Caleb Pena is a 74 y.o. male who presents with bilateral neck pain described as constant throbbing and sore after having a mechanical fall yesterday.  Patient states that he tripped over a box, hit his right forehead on the door molding.  This occurred yesterday afternoon.  He denies preceding chest pain shortness of breath palpitations or syncope causing the fall.  He denies headache, nausea, vomiting, blurry vision, double vision, loss of consciousness, amnesia.  No slurred speech, arm or leg weakness, discoordination.  No numbness in his upper or lower extremities or new or different tingling in his extremities.  No grip, arm, leg weakness.  He reports limited ability to rotate his head due to pain.  Flexion extension of the neck are "okay".   He tried sleeping on a recliner.  He has not tried any medications yet.  No alleviating factors.  Symptoms worse with head rotation, and with lying down flat.  He denies chest back abdominal pain or any other injury.  Past medical history of hypertension.  No history of GI bleed, chronic kidney disease, peptic ulcer disease, diabetes, concussion, anticoagulant antiplatelet use, liver disease.  PMD: PA Nelwyn Salisbury at [facility] primary care medicine  Past Medical History:  Diagnosis Date  . ED (erectile dysfunction)   . Hypercholesterolemia   . Hypertension   . Smoker     Past Surgical History:  Procedure Laterality Date  . BACK SURGERY    . COLONOSCOPY WITH PROPOFOL N/A 05/06/2015   Procedure: COLONOSCOPY WITH PROPOFOL;  Surgeon: Manya Silvas, MD;  Location: [facility] ENDOSCOPY;  Service: Endoscopy;  Laterality: N/A;    History reviewed. No pertinent family history.  Social History   Tobacco Use  . Smoking status: Former Smoker    Packs/day: 0.50    Types: Cigarettes  . Smokeless tobacco: Former Systems developer    Types: Chew    Quit date: 05/05/1983  Substance Use Topics  . Alcohol use: Yes  . Drug use: Not on file    No current  facility-administered medications for this encounter.  Current Outpatient Medications:  .  losartan (COZAAR) 50 MG tablet, Take 50 mg by mouth daily., Disp: , Rfl:  .  rOPINIRole (REQUIP) 0.25 MG tablet, Take 0.25 mg by mouth 3 (three) times daily., Disp: , Rfl:  .  simvastatin (ZOCOR) 40 MG tablet, Take 40 mg by mouth daily., Disp: , Rfl:  .  buPROPion (WELLBUTRIN SR) 150 MG 12 hr tablet, Take 150 mg by mouth 2 (two) times daily., Disp: , Rfl:  .  fenofibrate 54 MG tablet, Take 1 tablet by mouth daily., Disp: , Rfl:  .  ketoconazole (NIZORAL) 2 % shampoo, Apply 1 application topically 3 (three) times a week., Disp: , Rfl:  .  mupirocin ointment (BACTROBAN) 2 %, Apply 1 application topically 3 (three) times daily., Disp: 22 g, Rfl: 0 .  naproxen (NAPROSYN) 375 MG tablet, Take 1 tablet (375 mg total) by mouth 2 (two) times daily for 7 days., Disp: 14 tablet, Rfl: 0 .  sildenafil (REVATIO) 20 MG tablet, Take 20 mg by mouth 3 (three) times daily., Disp: , Rfl:  .  tiZANidine (ZANAFLEX) 2 MG tablet, Take 1 tablet (2 mg total) by mouth every 8 (eight) hours as needed for muscle spasms., Disp: 20 tablet, Rfl: 0  Allergies  Allergen Reactions  . Lisinopril Cough     ROS  As noted in HPI.   Physical Exam  BP (!) 157/83 (BP Location: Left  Arm)   Pulse 86   Temp 98.3 F (36.8 C) (Oral)   Resp 16   Ht 5\' 11"  (1.803 m)   Wt 95.3 kg   SpO2 98%   BMI 29.29 kg/m   Constitutional: Well developed, well nourished, no acute distress Eyes: PERRLA EOMI, conjunctiva normal bilaterally . no direct or consensual photophobia HENT: Normocephalic, superficial abrasion right forehead see picture.  No periorbital ecchymosis     Neck: Positive upper C-spine tenderness.  Positive bilateral trapezial tenderness, right sided spasm.  Patient able to flex/extend his neck without much problem.  Limited rotation.  Back: No T-spine tenderness.  No other tenderness over the ribs.  Respiratory: Normal  inspiratory effort Cardiovascular: Normal rate GI: nondistended skin: No rash, skin intact Musculoskeletal: no deformities Neurologic: Alert & oriented x 3, normal speech, lucid, cranial nerves III through XII intact, gait steady.  Coordination normal.  Strength upper and lower extremities equal.  Grip strength 5/5 and equal bilaterally.  GCS 15 Psychiatric: Speech and behavior appropriate   ED Course   Medications - No data to display  Orders Placed This Encounter  Procedures  . DG Cervical Spine Complete    Standing Status:   Standing    Number of Occurrences:   1    Order Specific Question:   Reason for Exam (SYMPTOM  OR DIAGNOSIS REQUIRED)    Answer:   fall tenderness upper c spine r/o fx    No results found for this or any previous visit (from the past 24 hour(s)). DG Cervical Spine Complete  Result Date: 01/18/2020 CLINICAL DATA:  Pain following EXAM: CERVICAL SPINE - COMPLETE 4+ VIEW COMPARISON:  None. FINDINGS: Frontal, lateral, open-mouth odontoid, and bilateral oblique views were obtained. There is no fracture or spondylolisthesis. Prevertebral soft tissues and predental space regions are normal. There is moderately severe disc space narrowing at all levels except for C2-3. There are anterior osteophytes at all levels except for C2. There is exit foraminal narrowing at C3-4 bilaterally, C4-5 bilaterally, C5-6 bilaterally, and at C6-7 bilaterally, most severe at C3-4 bilaterally and at C6-7 on the left. No erosive changes. Lung apices are clear. IMPRESSION: No evident fracture or spondylolisthesis.  Multilevel arthropathy. Electronically Signed   By: Lowella Grip III M.D.   On: 01/18/2020 16:39    ED Clinical Impression  1. Fall, initial encounter   2. Strain of neck muscle, initial encounter   3. Abrasion of face, initial encounter      ED Assessment/Plan  Discussed with patient that technically he qualifies for head CT given his age, however he has no headache, no  neurologic complaints, neurologic deficits,  the injury happened roughly 20 to 24 hours ago, and he is not on any anticoagulants or antiplatelets, we have decided to defer the head CT today.  States that he can get somebody to watch him for the next 24 hours.  will get a x-ray of the C-spine due to C-spine tenderness.  Reviewed imaging independently.  No evident fracture or spondylolisthesis.  Multilevel disc space narrowing.  See radiology report for full details.  No evidence of concussion.  Doubt ICH, SAH, subdural hematoma.  Will send home with Naprosyn twice a day, 1000 mg of Tylenol 3-4 times a day as needed, low-dose Zanaflex.  May increase to 4 mg.  Bactroban or bacitracin for the abrasion on the forehead.  Follow-up with primary care physician in several days, gave him strict ER return precautions.  Discussed  imaging, MDM, treatment plan,  and plan for follow-up with patient. Discussed sn/sx that should prompt return to the ED. patient agrees with plan.   Meds ordered this encounter  Medications  . naproxen (NAPROSYN) 375 MG tablet    Sig: Take 1 tablet (375 mg total) by mouth 2 (two) times daily for 7 days.    Dispense:  14 tablet    Refill:  0  . DISCONTD: tiZANidine (ZANAFLEX) 4 MG tablet    Sig: Take 1 tablet (4 mg total) by mouth every 8 (eight) hours as needed for muscle spasms.    Dispense:  30 tablet    Refill:  0  . mupirocin ointment (BACTROBAN) 2 %    Sig: Apply 1 application topically 3 (three) times daily.    Dispense:  22 g    Refill:  0  . tiZANidine (ZANAFLEX) 2 MG tablet    Sig: Take 1 tablet (2 mg total) by mouth every 8 (eight) hours as needed for muscle spasms.    Dispense:  20 tablet    Refill:  0    *This clinic note was created using [employer]. Therefore, there may be occasional mistakes despite careful proofreading.   ?    Melynda Ripple, MD 01/18/20 [phone]

## 2020-01-18 NOTE — ED Triage Notes (Signed)
Patient c/o states that he tripped over a box yesterday and fell and hit the right side of his head on the door molding.  Patient denies LOC.  Patient also reports neck pain.

## 2020-01-18 NOTE — Discharge Instructions (Addendum)
Your x-ray showed that you have arthritis in your neck however there is no acute fracture or any other worrisome findings.  You can take the Naprosyn twice a day.  You can take at 1000 mg of Tylenol 3 or 4 times a day as needed for pain.  This combined with the Naprosyn is a very effective combination for pain.  Try the Zanaflex as needed for muscle spasms.  Start with 2 mg 3 times a day.  You may increase it to 4 mg 3 times a day if you need to.  Please make sure that you have somebody to at least check in on you frequently for the next 24 hours.  Use Bactroban or bacitracin for the abrasion on your forehead.  Go immediately to the ER for vomiting, visual changes, arm or leg weakness, slurred speech, problems with your balance, headache, fevers above 100.4, or for other concerns.

## 2020-09-30 ENCOUNTER — Encounter: Payer: Self-pay | Admitting: Dermatology

## 2020-09-30 ENCOUNTER — Other Ambulatory Visit: Payer: Self-pay

## 2020-09-30 ENCOUNTER — Ambulatory Visit (INDEPENDENT_AMBULATORY_CARE_PROVIDER_SITE_OTHER): Payer: Medicare Other | Admitting: Dermatology

## 2020-09-30 DIAGNOSIS — L820 Inflamed seborrheic keratosis: Secondary | ICD-10-CM

## 2020-09-30 DIAGNOSIS — Z86018 Personal history of other benign neoplasm: Secondary | ICD-10-CM

## 2020-09-30 DIAGNOSIS — L853 Xerosis cutis: Secondary | ICD-10-CM

## 2020-09-30 DIAGNOSIS — L814 Other melanin hyperpigmentation: Secondary | ICD-10-CM | POA: Diagnosis not present

## 2020-09-30 DIAGNOSIS — L578 Other skin changes due to chronic exposure to nonionizing radiation: Secondary | ICD-10-CM

## 2020-09-30 DIAGNOSIS — L719 Rosacea, unspecified: Secondary | ICD-10-CM

## 2020-09-30 DIAGNOSIS — D229 Melanocytic nevi, unspecified: Secondary | ICD-10-CM

## 2020-09-30 DIAGNOSIS — L821 Other seborrheic keratosis: Secondary | ICD-10-CM

## 2020-09-30 DIAGNOSIS — Z1283 Encounter for screening for malignant neoplasm of skin: Secondary | ICD-10-CM

## 2020-09-30 DIAGNOSIS — D1800 Hemangioma unspecified site: Secondary | ICD-10-CM

## 2020-09-30 NOTE — Progress Notes (Signed)
   Follow-Up Visit   Subjective  Caleb Pena is a 74 y.o. male who presents for the following: Annual Exam (Hx dysplastic nevus ). The patient presents for Total-Body Skin Exam (TBSE) for skin cancer screening and mole check. The patient has noticed an itchy area on his back that he would like checked.   The following portions of the chart were reviewed this encounter and updated as appropriate:   Tobacco  Allergies  Meds  Problems  Med Hx  Surg Hx  Fam Hx     Review of Systems:  No other skin or systemic complaints except as noted in HPI or Assessment and Plan.  Objective  Well appearing patient in no apparent distress; mood and affect are within normal limits.  A full examination was performed including scalp, head, eyes, ears, nose, lips, neck, chest, axillae, abdomen, back, buttocks, bilateral upper extremities, bilateral lower extremities, hands, feet, fingers, toes, fingernails, and toenails. All findings within normal limits unless otherwise noted below.  Objective  Back x 20 (20): Erythematous keratotic or waxy stuck-on papule or plaque.   Objective  Face: Dilated vessels of the chin and cheeks.  Assessment & Plan  Inflamed seborrheic keratosis (20) Back x 20  Destruction of lesion - Back x 20 Complexity: simple   Destruction method: cryotherapy   Informed consent: discussed and consent obtained   Timeout:  patient name, date of birth, surgical site, and procedure verified Lesion destroyed using liquid nitrogen: Yes   Region frozen until ice ball extended beyond lesion: Yes   Outcome: patient tolerated procedure well with no complications   Post-procedure details: wound care instructions given    Rosacea Face  Discussed BBL laser tx.   Skin cancer screening   Lentigines - Scattered tan macules - Discussed due to sun exposure - Benign, observe - Call for any changes  Seborrheic Keratoses - Stuck-on, waxy, tan-brown papules and plaques  - Discussed  benign etiology and prognosis. - Observe - Call for any changes  Melanocytic Nevi - Tan-brown and/or pink-flesh-colored symmetric macules and papules - Benign appearing on exam today - Observation - Call clinic for new or changing moles - Recommend daily use of broad spectrum spf 30+ sunscreen to sun-exposed areas.   Hemangiomas - Red papules - Discussed benign nature - Observe - Call for any changes  Actinic Damage - Chronic, secondary to cumulative UV/sun exposure - diffuse scaly erythematous macules with underlying dyspigmentation - Recommend daily broad spectrum sunscreen SPF 30+ to sun-exposed areas, reapply every 2 hours as needed.  - Call for new or changing lesions.  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  - Call if any new or changing lesions are noted between office visits  Xerosis - diffuse xerotic patches - recommend gentle, hydrating skin care - gentle skin care handout given  Skin cancer screening performed today.  Return in about 1 year (around 09/30/2021) for TBSE - hx dysplastic nevus .  Luther Redo, CMA, am acting as scribe for Sarina Ser, MD .  Documentation: I have reviewed the above documentation for accuracy and completeness, and I agree with the above.  Sarina Ser, MD

## 2020-10-06 ENCOUNTER — Encounter: Payer: Self-pay | Admitting: Dermatology

## 2021-09-30 ENCOUNTER — Encounter
Admission: RE | Disposition: A | Discharge status: Home / Self Care | Payer: Self-pay | Source: Home / Self Care | Attending: Gastroenterology

## 2021-09-30 ENCOUNTER — Ambulatory Visit: Payer: Medicare Other | Admitting: Certified Registered Nurse Anesthetist

## 2021-09-30 ENCOUNTER — Ambulatory Visit
Admission: RE | Admit: 2021-09-30 | Discharge: 2021-09-30 | Disposition: A | Discharge status: Home / Self Care | Payer: Medicare Other | Attending: Gastroenterology | Admitting: Gastroenterology

## 2021-09-30 DIAGNOSIS — Z800 Family history of malignant neoplasm of digestive organs: Secondary | ICD-10-CM | POA: Insufficient documentation

## 2021-09-30 DIAGNOSIS — K640 First degree hemorrhoids: Secondary | ICD-10-CM | POA: Diagnosis not present

## 2021-09-30 DIAGNOSIS — K2100 Gastro-esophageal reflux disease with esophagitis, without bleeding: Secondary | ICD-10-CM | POA: Insufficient documentation

## 2021-09-30 DIAGNOSIS — D123 Benign neoplasm of transverse colon: Secondary | ICD-10-CM | POA: Insufficient documentation

## 2021-09-30 DIAGNOSIS — Z1211 Encounter for screening for malignant neoplasm of colon: Secondary | ICD-10-CM | POA: Diagnosis not present

## 2021-09-30 DIAGNOSIS — D124 Benign neoplasm of descending colon: Secondary | ICD-10-CM | POA: Diagnosis not present

## 2021-09-30 DIAGNOSIS — K5730 Diverticulosis of large intestine without perforation or abscess without bleeding: Secondary | ICD-10-CM | POA: Diagnosis not present

## 2021-09-30 HISTORY — DX: Sleep apnea, unspecified: G47.30

## 2021-09-30 HISTORY — PX: COLONOSCOPY WITH PROPOFOL: SHX5780

## 2021-09-30 HISTORY — PX: ESOPHAGOGASTRODUODENOSCOPY (EGD) WITH PROPOFOL: SHX5813

## 2021-09-30 SURGERY — ESOPHAGOGASTRODUODENOSCOPY (EGD) WITH PROPOFOL
Anesthesia: General

## 2021-09-30 MED ORDER — PROPOFOL 500 MG/50ML IV EMUL
INTRAVENOUS | Status: DC | PRN
  Administered 2021-09-30: 150 ug/kg/min via INTRAVENOUS

## 2021-09-30 MED ORDER — PROPOFOL 10 MG/ML IV BOLUS
INTRAVENOUS | Status: AC
  Filled 2021-09-30: qty 20

## 2021-09-30 MED ORDER — GLYCOPYRROLATE 0.2 MG/ML IJ SOLN
INTRAMUSCULAR | Status: DC | PRN
  Administered 2021-09-30: .2 mg via INTRAVENOUS

## 2021-09-30 MED ORDER — PHENYLEPHRINE 40 MCG/ML (10ML) SYRINGE FOR IV PUSH (FOR BLOOD PRESSURE SUPPORT)
PREFILLED_SYRINGE | INTRAVENOUS | Status: DC | PRN
  Administered 2021-09-30 (×5): 160 ug via INTRAVENOUS

## 2021-09-30 MED ORDER — PROPOFOL 10 MG/ML IV BOLUS
INTRAVENOUS | Status: DC | PRN
  Administered 2021-09-30: 20 mg via INTRAVENOUS
  Administered 2021-09-30: 60 mg via INTRAVENOUS

## 2021-09-30 MED ORDER — LIDOCAINE HCL (CARDIAC) PF 100 MG/5ML IV SOSY
PREFILLED_SYRINGE | INTRAVENOUS | Status: DC | PRN
  Administered 2021-09-30: 100 mg via INTRAVENOUS

## 2021-09-30 MED ORDER — SODIUM CHLORIDE 0.9 % IV SOLN
INTRAVENOUS | Status: DC
  Administered 2021-09-30: 20 mL/h via INTRAVENOUS

## 2021-09-30 MED ORDER — EPHEDRINE SULFATE 50 MG/ML IJ SOLN
INTRAMUSCULAR | Status: DC | PRN
  Administered 2021-09-30 (×2): 10 mg via INTRAVENOUS

## 2021-09-30 MED ORDER — STERILE WATER FOR IRRIGATION IR SOLN
Status: DC | PRN
  Administered 2021-09-30: 30 mL

## 2021-09-30 NOTE — Transfer of Care (Signed)
Immediate Anesthesia Transfer of Care Note  Patient: Caleb Pena  Procedure(s) Performed: ESOPHAGOGASTRODUODENOSCOPY (EGD) WITH PROPOFOL COLONOSCOPY WITH PROPOFOL  Patient Location: Endoscopy Unit  Anesthesia Type:General  Level of Consciousness: drowsy  Airway & Oxygen Therapy: Patient Spontanous Breathing  Post-op Assessment: Report given to RN and Post -op Vital signs reviewed and stable  Post vital signs: Reviewed and stable  Last Vitals:  Vitals Value Taken Time  BP 110/45 09/30/21 1314  Temp    Pulse 82 09/30/21 1314  Resp 18 09/30/21 1314  SpO2 96 % 09/30/21 1314  Vitals shown include unvalidated device data.  Last Pain:  Vitals:   09/30/21 1143  TempSrc: Temporal  PainSc: 0-No pain         Complications: No notable events documented.

## 2021-09-30 NOTE — Anesthesia Postprocedure Evaluation (Signed)
Anesthesia Post Note  Patient: Caleb Pena  Procedure(s) Performed: ESOPHAGOGASTRODUODENOSCOPY (EGD) WITH PROPOFOL COLONOSCOPY WITH PROPOFOL  Patient location during evaluation: PACU Anesthesia Type: General Level of consciousness: awake Pain management: pain level controlled Vital Signs Assessment: post-procedure vital signs reviewed and stable Respiratory status: spontaneous breathing Cardiovascular status: blood pressure returned to baseline Anesthetic complications: no   No notable events documented.   Last Vitals:  Vitals:   09/30/21 1324 09/30/21 1334  BP: 96/75 113/75  Pulse: 87 76  Resp: (!) 22 16  Temp:    SpO2: 96% 98%    Last Pain:  Vitals:   09/30/21 1334  TempSrc:   PainSc: 0-No pain                 VAN STAVEREN,Makye Radle

## 2021-09-30 NOTE — Anesthesia Procedure Notes (Signed)
Date/Time: 09/30/2021 12:38 PM Performed by: Lily Peer, Amarachi Kotz, CRNA Pre-anesthesia Checklist: Patient identified, Emergency Drugs available and Suction available Patient Re-evaluated:Patient Re-evaluated prior to induction Oxygen Delivery Method: Simple face mask Induction Type: IV induction

## 2021-09-30 NOTE — Op Note (Signed)
[facility] Gastroenterology Patient Name: Caleb Pena Procedure Date: 09/30/2021 12:38 PM MRN: [id number] Account #: [id number] Date of Birth: Sep 06, 1946 Admit Type: Outpatient Age: 75 Room: [facility] ENDO ROOM 3 Gender: Male Note Status: Finalized Instrument Name: Upper Endoscope [id number] Procedure:             Upper GI endoscopy Indications:           Gastro-esophageal reflux disease Providers:             Andrey Farmer MD, MD Referring MD:          Bo Mcclintock. Vickki Muff (Referring MD) Medicines:             Monitored Anesthesia Care Complications:         No immediate complications. Procedure:             Pre-Anesthesia Assessment:                        - Prior to the procedure, a History and Physical was                         performed, and patient medications and allergies were                         reviewed. The patient is competent. The risks and                         benefits of the procedure and the sedation options and                         risks were discussed with the patient. All questions                         were answered and informed consent was obtained.                         Patient identification and proposed procedure were                         verified by the physician, the nurse, the                         anesthesiologist, the anesthetist and the technician                         in the pre-procedure area in the endoscopy suite.                         Mental Status Examination: alert and oriented. Airway                         Examination: normal oropharyngeal airway and neck                         mobility. Respiratory Examination: clear to                         auscultation. CV Examination: normal. Prophylactic  Antibiotics: The patient does not require prophylactic                         antibiotics. Prior Anticoagulants: The patient has                         taken no previous anticoagulant or  antiplatelet                         agents. ASA Grade Assessment: II - A patient with mild                         systemic disease. After reviewing the risks and                         benefits, the patient was deemed in satisfactory                         condition to undergo the procedure. The anesthesia                         plan was to use monitored anesthesia care (MAC).                         Immediately prior to administration of medications,                         the patient was re-assessed for adequacy to receive                         sedatives. The heart rate, respiratory rate, oxygen                         saturations, blood pressure, adequacy of pulmonary                         ventilation, and response to care were monitored                         throughout the procedure. The physical status of the                         patient was re-assessed after the procedure.                        After obtaining informed consent, the endoscope was                         passed under direct vision. Throughout the procedure,                         the patient's blood pressure, pulse, and oxygen                         saturations were monitored continuously. The Endoscope                         was introduced through the mouth, and advanced to the  second part of duodenum. The upper GI endoscopy was                         accomplished without difficulty. The patient tolerated                         the procedure well. Findings:      LA Grade A (one or more mucosal breaks less than 5 mm, not extending       between tops of 2 mucosal folds) esophagitis with no bleeding was found.      The entire examined stomach was normal.      The examined duodenum was normal. Impression:            - LA Grade A reflux esophagitis with no bleeding.                        - Normal stomach.                        - Normal examined duodenum.                         - No specimens collected. Recommendation:        - Perform a colonoscopy today. Procedure Code(s):     --- Professional ---                        [phone], Esophagogastroduodenoscopy, flexible,                         transoral; diagnostic, including collection of                         specimen(s) by brushing or washing, when performed                         (separate procedure) Diagnosis Code(s):     --- Professional ---                        K21.00, Gastro-esophageal reflux disease with                         esophagitis, without bleeding CPT copyright 2019 American Medical Association. All rights reserved. The codes documented in this report are preliminary and upon coder review may  be revised to meet current compliance requirements. Andrey Farmer MD, MD 09/30/2021 1:15:05 PM Number of Addenda: 0 Note Initiated On: 09/30/2021 12:38 PM Estimated Blood Loss:  Estimated blood loss: none.      [facility]

## 2021-09-30 NOTE — Anesthesia Preprocedure Evaluation (Signed)
Anesthesia Evaluation  Patient identified by MRN, date of birth, ID band Patient awake    Reviewed: Allergy & Precautions, NPO status , Patient's Chart, lab work & pertinent test results  Airway Mallampati: II  TM Distance: >3 FB Neck ROM: full    Dental  (+) Teeth Intact   Pulmonary neg pulmonary ROS, sleep apnea , former smoker,    Pulmonary exam normal  + decreased breath sounds      Cardiovascular Exercise Tolerance: Good hypertension, Pt. on medications negative cardio ROS Normal cardiovascular exam Rhythm:Regular Rate:Normal     Neuro/Psych negative neurological ROS  negative psych ROS   GI/Hepatic negative GI ROS, Neg liver ROS,   Endo/Other  negative endocrine ROS  Renal/GU negative Renal ROS  negative genitourinary   Musculoskeletal negative musculoskeletal ROS (+)   Abdominal Normal abdominal exam  (+)   Peds negative pediatric ROS (+)  Hematology negative hematology ROS (+)   Anesthesia Other Findings Past Medical History: 09/24/2019: Dysplastic nevus     Comment:  Right mid dorsum forearm. Moderate atypia, close to               margin. No date: ED (erectile dysfunction) No date: Hypercholesterolemia No date: Hypertension No date: Sleep apnea No date: Smoker  Past Surgical History: No date: BACK SURGERY 05/06/2015: COLONOSCOPY WITH PROPOFOL; N/A     Comment:  Procedure: COLONOSCOPY WITH PROPOFOL;  Surgeon: Manya Silvas, MD;  Location: [place];  Service:               Endoscopy;  Laterality: N/A;  BMI    Body Mass Index: 30.42 kg/m      Reproductive/Obstetrics negative OB ROS                             Anesthesia Physical Anesthesia Plan  ASA: 2  Anesthesia Plan: General   Post-op Pain Management:    Induction: Intravenous  PONV Risk Score and Plan: Propofol infusion and TIVA  Airway Management Planned: Natural Airway and Nasal  Cannula  Additional Equipment:   Intra-op Plan:   Post-operative Plan:   Informed Consent: I have reviewed the patients History and Physical, chart, labs and discussed the procedure including the risks, benefits and alternatives for the proposed anesthesia with the patient or authorized representative who has indicated his/her understanding and acceptance.     Dental Advisory Given  Plan Discussed with: CRNA and Surgeon  Anesthesia Plan Comments:         Anesthesia Quick Evaluation

## 2021-09-30 NOTE — H&P (Signed)
Outpatient short stay form Pre-procedure 09/30/2021  Lesly Rubenstein, MD  Primary Physician: Hilton Sinclair, PA-C  Reason for visit:  GERD/Surveillance colonoscopy  History of present illness:   75 y/o gentleman with history of GERD and many polyps on last colonoscopy here for EGD/Colonoscopy. No blood thinners. History of hernia repair. Father had colon cancer in his 75's.    Current Facility-Administered Medications:    0.9 %  sodium chloride infusion, , Intravenous, Continuous, Roark Rufo, Hilton Cork, MD, Last Rate: 20 mL/hr at 09/30/21 1155, 20 mL/hr at 09/30/21 1155  Medications Prior to Admission  Medication Sig Dispense Refill Last Dose   Alpha-Lipoic Acid 100 MG CAPS Take by mouth.   09/29/2021   buPROPion (WELLBUTRIN SR) 150 MG 12 hr tablet Take 150 mg by mouth 2 (two) times daily.   09/29/2021   cholecalciferol (VITAMIN D3) 25 MCG (1000 UNIT) tablet Take 1,000 Units by mouth daily.   09/29/2021   fenofibrate 54 MG tablet Take 1 tablet by mouth daily.   09/29/2021   ketoconazole (NIZORAL) 2 % shampoo Apply 1 application topically 3 (three) times a week.   09/29/2021   losartan (COZAAR) 50 MG tablet Take 50 mg by mouth daily.   09/30/2021 at 0530   mupirocin ointment (BACTROBAN) 2 % Apply 1 application topically 3 (three) times daily. 22 g 0 09/29/2021   rOPINIRole (REQUIP) 0.25 MG tablet Take 0.25 mg by mouth 3 (three) times daily.   09/29/2021   sildenafil (REVATIO) 20 MG tablet Take 20 mg by mouth 3 (three) times daily.   Past Month   simvastatin (ZOCOR) 40 MG tablet Take 40 mg by mouth daily.   09/29/2021   tiZANidine (ZANAFLEX) 2 MG tablet Take 1 tablet (2 mg total) by mouth every 8 (eight) hours as needed for muscle spasms. 20 tablet 0 09/29/2021   vitamin B-12 (CYANOCOBALAMIN) 100 MCG tablet Take 100 mcg by mouth daily.   09/29/2021     Allergies  Allergen Reactions   Lisinopril Cough     Past Medical History:  Diagnosis Date   Dysplastic nevus 09/24/2019    Right mid dorsum forearm. Moderate atypia, close to margin.   ED (erectile dysfunction)    Hypercholesterolemia    Hypertension    Sleep apnea    Smoker     Review of systems:  Otherwise negative.    Physical Exam  Gen: Alert, oriented. Appears stated age.  HEENT: PERRLA. Lungs: CTA, no wheezes. CV: RRR Abd: soft, benign, no masses Ext: No edema    Planned procedures: Proceed with EGD/colonoscopy. The patient understands the nature of the planned procedure, indications, risks, alternatives and potential complications including but not limited to bleeding, infection, perforation, damage to internal organs and possible oversedation/side effects from anesthesia. The patient agrees and gives consent to proceed.  Please refer to procedure notes for findings, recommendations and patient disposition/instructions.     Lesly Rubenstein, MD [facility] Gastroenterology

## 2021-09-30 NOTE — Interval H&P Note (Signed)
History and Physical Interval Note:  09/30/2021 12:19 PM  Caleb Pena  has presented today for surgery, with the diagnosis of H/O Colon Polys (Z86.010) H/O GERD (Z87.19).  The various methods of treatment have been discussed with the patient and family. After consideration of risks, benefits and other options for treatment, the patient has consented to  Procedure(s): ESOPHAGOGASTRODUODENOSCOPY (EGD) WITH PROPOFOL (N/A) COLONOSCOPY WITH PROPOFOL (N/A) as a surgical intervention.  The patient's history has been reviewed, patient examined, no change in status, stable for surgery.  I have reviewed the patient's chart and labs.  Questions were answered to the patient's satisfaction.     Lesly Rubenstein  Ok to proceed with EGD/Colonoscopy

## 2021-09-30 NOTE — Op Note (Signed)
[facility] Gastroenterology Patient Name: Caleb Pena Procedure Date: 09/30/2021 12:37 PM MRN: [id number] Account #: [id number] Date of Birth: 11/28/1945 Admit Type: Outpatient Age: 75 Room: [facility] ENDO ROOM 3 Gender: Male Note Status: Finalized Instrument Name: Jasper Riling [id number] Procedure:             Colonoscopy Indications:           Surveillance: Personal history of adenomatous polyps                         on last colonoscopy > 5 years ago Providers:             Andrey Farmer MD, MD Referring MD:          Bo Mcclintock. Vickki Muff (Referring MD) Medicines:             Monitored Anesthesia Care Complications:         No immediate complications. Estimated blood loss:                         Minimal. Procedure:             Pre-Anesthesia Assessment:                        - Prior to the procedure, a History and Physical was                         performed, and patient medications and allergies were                         reviewed. The patient is competent. The risks and                         benefits of the procedure and the sedation options and                         risks were discussed with the patient. All questions                         were answered and informed consent was obtained.                         Patient identification and proposed procedure were                         verified by the physician, the nurse, the                         anesthesiologist, the anesthetist and the technician                         in the pre-procedure area in the endoscopy suite.                         Mental Status Examination: alert and oriented. Airway                         Examination: normal oropharyngeal airway and neck  mobility. Respiratory Examination: clear to                         auscultation. CV Examination: normal. Prophylactic                         Antibiotics: The patient does not require prophylactic                          antibiotics. Prior Anticoagulants: The patient has                         taken no previous anticoagulant or antiplatelet                         agents. ASA Grade Assessment: II - A patient with mild                         systemic disease. After reviewing the risks and                         benefits, the patient was deemed in satisfactory                         condition to undergo the procedure. The anesthesia                         plan was to use monitored anesthesia care (MAC).                         Immediately prior to administration of medications,                         the patient was re-assessed for adequacy to receive                         sedatives. The heart rate, respiratory rate, oxygen                         saturations, blood pressure, adequacy of pulmonary                         ventilation, and response to care were monitored                         throughout the procedure. The physical status of the                         patient was re-assessed after the procedure.                        After obtaining informed consent, the colonoscope was                         passed under direct vision. Throughout the procedure,                         the patient's blood pressure, pulse, and oxygen  saturations were monitored continuously. The                         Colonoscope was introduced through the anus and                         advanced to the the cecum, identified by appendiceal                         orifice and ileocecal valve. The colonoscopy was                         performed without difficulty. The patient tolerated                         the procedure well. The quality of the bowel                         preparation was good. Findings:      The perianal and digital rectal examinations were normal.      A 2 mm polyp was found in the transverse colon. The polyp was sessile.       The polyp was removed with a  cold snare. Resection and retrieval were       complete. Estimated blood loss was minimal.      A 3 mm polyp was found in the descending colon. The polyp was sessile.       The polyp was removed with a cold snare. Resection and retrieval were       complete. Estimated blood loss was minimal.      Many small-mouthed diverticula were found in the sigmoid colon.      Internal hemorrhoids were found during retroflexion. The hemorrhoids       were Grade I (internal hemorrhoids that do not prolapse).      The exam was otherwise without abnormality on direct and retroflexion       views. Impression:            - One 2 mm polyp in the transverse colon, removed with                         a cold snare. Resected and retrieved.                        - One 3 mm polyp in the descending colon, removed with                         a cold snare. Resected and retrieved.                        - Diverticulosis in the sigmoid colon.                        - Internal hemorrhoids.                        - The examination was otherwise normal on direct and                         retroflexion views. Recommendation:        -  Discharge patient to home.                        - Resume previous diet.                        - Continue present medications.                        - Await pathology results.                        - Repeat colonoscopy in 5 years for surveillance.                        - Return to referring physician as previously                         scheduled. Procedure Code(s):     --- Professional ---                        [id number], Colonoscopy, flexible; with removal of                         tumor(s), polyp(s), or other lesion(s) by snare                         technique Diagnosis Code(s):     --- Professional ---                        Z86.010, Personal history of colonic polyps                        K63.5, Polyp of colon                        K64.0, First degree hemorrhoids                         K57.30, Diverticulosis of large intestine without                         perforation or abscess without bleeding CPT copyright 2019 American Medical Association. All rights reserved. The codes documented in this report are preliminary and upon coder review may  be revised to meet current compliance requirements. Andrey Farmer MD, MD 09/30/2021 1:17:41 PM Number of Addenda: 0 Note Initiated On: 09/30/2021 12:37 PM Scope Withdrawal Time: 0 hours 13 minutes 30 seconds  Total Procedure Duration: 0 hours 20 minutes 31 seconds  Estimated Blood Loss:  Estimated blood loss was minimal.      [facility]

## 2021-10-03 ENCOUNTER — Other Ambulatory Visit: Payer: Self-pay

## 2021-10-03 ENCOUNTER — Encounter: Payer: Self-pay | Admitting: Gastroenterology

## 2021-10-03 ENCOUNTER — Ambulatory Visit (INDEPENDENT_AMBULATORY_CARE_PROVIDER_SITE_OTHER): Payer: Medicare Other | Admitting: Dermatology

## 2021-10-03 DIAGNOSIS — Z86018 Personal history of other benign neoplasm: Secondary | ICD-10-CM

## 2021-10-03 DIAGNOSIS — D229 Melanocytic nevi, unspecified: Secondary | ICD-10-CM | POA: Diagnosis not present

## 2021-10-03 DIAGNOSIS — L814 Other melanin hyperpigmentation: Secondary | ICD-10-CM

## 2021-10-03 DIAGNOSIS — Z1283 Encounter for screening for malignant neoplasm of skin: Secondary | ICD-10-CM | POA: Diagnosis not present

## 2021-10-03 DIAGNOSIS — D1800 Hemangioma unspecified site: Secondary | ICD-10-CM

## 2021-10-03 DIAGNOSIS — L719 Rosacea, unspecified: Secondary | ICD-10-CM

## 2021-10-03 DIAGNOSIS — L821 Other seborrheic keratosis: Secondary | ICD-10-CM

## 2021-10-03 DIAGNOSIS — L578 Other skin changes due to chronic exposure to nonionizing radiation: Secondary | ICD-10-CM | POA: Diagnosis not present

## 2021-10-03 LAB — SURGICAL PATHOLOGY

## 2021-10-03 NOTE — Patient Instructions (Signed)
Melanoma ABCDEs  Melanoma is the most dangerous type of skin cancer, and is the leading cause of death from skin disease.  You are more likely to develop melanoma if you: Have light-colored skin, light-colored eyes, or red or blond hair Spend a lot of time in the sun Tan regularly, either outdoors or in a tanning bed Have had blistering sunburns, especially during childhood Have a close family member who has had a melanoma Have atypical moles or large birthmarks  Early detection of melanoma is key since treatment is typically straightforward and cure rates are extremely high if we catch it early.   The first sign of melanoma is often a change in a mole or a new dark spot.  The ABCDE system is a way of remembering the signs of melanoma.  A for asymmetry:  The two halves do not match. B for border:  The edges of the growth are irregular. C for color:  A mixture of colors are present instead of an even brown color. D for diameter:  Melanomas are usually (but not always) greater than 41mm - the size of a pencil eraser. E for evolution:  The spot keeps changing in size, shape, and color.  Please check your skin once per month between visits. You can use a small mirror in front and a large mirror behind you to keep an eye on the back side or your body.   If you see any new or changing lesions before your next follow-up, please call to schedule a visit.  Please continue daily skin protection including broad spectrum sunscreen SPF 30+ to sun-exposed areas, reapplying every 2 hours as needed when you're outdoors.    If You Need Anything After Your Visit  If you have any questions or concerns for your doctor, please call our main line at [phone] and press option 4 to reach your doctor's medical assistant. If no one answers, please leave a voicemail as directed and we will return your call as soon as possible. Messages left after 4 pm will be answered the following business day.   You may also  send [place] a message via [place]. We typically respond to MyChart messages within 1-2 business days.  For prescription refills, please ask your pharmacy to contact our office. Our fax number is [phone].  If you have an urgent issue when the clinic is closed that cannot wait until the next business day, you can page your doctor at the number below.    Please note that while we do our best to be available for urgent issues outside of office hours, we are not available 24/7.   If you have an urgent issue and are unable to reach [place], you may choose to seek medical care at your doctor's office, retail clinic, urgent care center, or emergency room.  If you have a medical emergency, please immediately call 911 or go to the emergency department.  Pager Numbers  - Dr. Nehemiah Massed: [phone]  - Dr. Laurence Ferrari: [phone]  - Dr. Nicole Kindred: [phone]  In the event of inclement weather, please call our main line at [phone] for an update on the status of any delays or closures.  Dermatology Medication Tips: Please keep the boxes that topical medications come in in order to help keep track of the instructions about where and how to use these. Pharmacies typically print the medication instructions only on the boxes and not directly on the medication tubes.   If your medication is too expensive, please contact  our office at [phone] option 4 or send [place] a message through [place].   We are unable to tell what your co-pay for medications will be in advance as this is different depending on your insurance coverage. However, we may be able to find a substitute medication at lower cost or fill out paperwork to get insurance to cover a needed medication.   If a prior authorization is required to get your medication covered by your insurance company, please allow [place] 1-2 business days to complete this process.  Drug prices often vary depending on where the prescription is filled and some pharmacies may  offer cheaper prices.  The website www.goodrx.com contains coupons for medications through different pharmacies. The prices here do not account for what the cost may be with help from insurance (it may be cheaper with your insurance), but the website can give you the price if you did not use any insurance.  - You can print the associated coupon and take it with your prescription to the pharmacy.  - You may also stop by our office during regular business hours and pick up a GoodRx coupon card.  - If you need your prescription sent electronically to a different pharmacy, notify our office through [facility] or by phone at [phone] option 4.     Si Usted Necesita Algo Despus de Su Visita  Tambin puede enviarnos un mensaje a travs de Pharmacist, community. Por lo general respondemos a los mensajes de MyChart en el transcurso de 1 a 2 das hbiles.  Para renovar recetas, por favor pida a su farmacia que se ponga en contacto con nuestra oficina. Harland Dingwall de fax es [place] [phone].  Si tiene un asunto urgente cuando la clnica est cerrada y que no puede esperar hasta el siguiente da hbil, puede llamar/localizar a su doctor(a) al nmero que aparece a continuacin.   Por favor, tenga en cuenta que aunque hacemos todo lo posible para estar disponibles para asuntos urgentes fuera del horario de [place], no estamos disponibles las 24 horas del da, los 7 das de la [place].   Si tiene un problema urgente y no puede comunicarse con nosotros, puede optar por buscar atencin mdica  en el consultorio de su doctor(a), en una clnica privada, en un centro de atencin urgente o en una sala de emergencias.  Si tiene Engineering geologist, por favor llame inmediatamente al 911 o vaya a la sala de emergencias.  Nmeros de bper  - Dr. Nehemiah Massed: [phone]  - Dra. Moye: [phone]  - Dra. Nicole Kindred: [phone]  En caso de inclemencias del [place], por favor llame a Johnsie Kindred principal al  [phone] para una actualizacin sobre el [place] de cualquier retraso o cierre.  Consejos para la medicacin en dermatologa: Por favor, guarde las cajas en las que vienen los medicamentos de uso tpico para ayudarle a seguir las instrucciones sobre dnde y cmo usarlos. Las farmacias generalmente imprimen las instrucciones del medicamento slo en las cajas y no directamente en los tubos del [place].   Si su medicamento es muy caro, por favor, pngase en contacto con Zigmund Daniel llamando al [phone] y presione la opcin 4 o envenos un mensaje a travs de Pharmacist, community.   No podemos decirle cul ser su copago por los medicamentos por adelantado ya que esto es diferente dependiendo de la cobertura de su seguro. Sin embargo, es posible que podamos encontrar un medicamento sustituto a Electrical engineer un formulario para que el seguro cubra el medicamento  que se considera necesario.   Si se requiere una autorizacin previa para que su compaa de seguros [place] su medicamento, por favor permtanos de 1 a 2 das hbiles para completar este proceso.  Los precios de los medicamentos varan con frecuencia dependiendo del Environmental consultant de dnde se surte la receta y alguna farmacias pueden ofrecer precios ms baratos.  El sitio web www.goodrx.com tiene cupones para medicamentos de Airline pilot. Los precios aqu no tienen en cuenta lo que podra costar con la ayuda del seguro (puede ser ms barato con su seguro), pero el sitio web puede darle el precio si no utiliz Research scientist (physical sciences).  - Puede imprimir el cupn correspondiente y llevarlo con su receta a la farmacia.  - Tambin puede pasar por nuestra oficina durante el horario de atencin regular y Charity fundraiser una tarjeta de cupones de GoodRx.  - Si necesita que su receta se enve electrnicamente a una farmacia diferente, informe a nuestra oficina a travs de MyChart de [place] o por telfono llamando al [phone] y presione la opcin 4.

## 2021-10-03 NOTE — Progress Notes (Signed)
Follow-Up Visit   Subjective  Caleb Pena is a 75 y.o. male who presents for the following: Annual Exam (Patient here for full body skin exam and skin cancer screening. Patient with a hx of dysplastic nevi. Patient advises there is a dark spot at right underarm, patient noticed a few months ago. ). The patient presents for Total-Body Skin Exam (TBSE) for skin cancer screening and mole check.  The patient has spots, moles and lesions to be evaluated, some may be new or changing and the patient has concerns that these could be cancer.  The following portions of the chart were reviewed this encounter and updated as appropriate:   Tobacco   Allergies   Meds   Problems   Med Hx   Surg Hx   Fam Hx      Review of Systems:  No other skin or systemic complaints except as noted in HPI or Assessment and Plan.  Objective  Well appearing patient in no apparent distress; mood and affect are within normal limits.  A full examination was performed including scalp, head, eyes, ears, nose, lips, neck, chest, axillae, abdomen, back, buttocks, bilateral upper extremities, bilateral lower extremities, hands, feet, fingers, toes, fingernails, and toenails. All findings within normal limits unless otherwise noted below.  face Erythema   Assessment & Plan  Rosacea face  Rosacea is a chronic progressive skin condition usually affecting the face of adults, causing redness and/or acne bumps. It is treatable but not curable. It sometimes affects the eyes (ocular rosacea) as well. It may respond to topical and/or systemic medication and can flare with stress, sun exposure, alcohol, exercise and some foods.  Daily application of broad spectrum spf 30+ sunscreen to face is recommended to reduce flares.  Discussed laser if patient would like to treat.  Discussed the treatment option of BBL/laser.  Typically we recommend 1-3 treatment sessions about 5-8 weeks apart for best results.  The patient's condition may  require "maintenance treatments" in the future.  The fee for BBL / laser treatments is $350 per treatment session for the whole face.  A fee can be quoted for other parts of the body. Insurance typically does not pay for BBL/laser treatments and therefore the fee is an out-of-pocket cost.  Skin cancer screening  Lentigines - Scattered tan macules - Due to sun exposure - Benign-appearing, observe - Recommend daily broad spectrum sunscreen SPF 30+ to sun-exposed areas, reapply every 2 hours as needed. - Call for any changes  Seborrheic Keratoses - Stuck-on, waxy, tan-brown papules and/or plaques  - Benign-appearing - Discussed benign etiology and prognosis. - Observe - Call for any changes  Melanocytic Nevi - Tan-brown and/or pink-flesh-colored symmetric macules and papules - Benign appearing on exam today - Observation - Call clinic for new or changing moles - Recommend daily use of broad spectrum spf 30+ sunscreen to sun-exposed areas.   Hemangiomas - Red papules - Discussed benign nature - Observe - Call for any changes  Actinic Damage - Chronic condition, secondary to cumulative UV/sun exposure - diffuse scaly erythematous macules with underlying dyspigmentation - Recommend daily broad spectrum sunscreen SPF 30+ to sun-exposed areas, reapply every 2 hours as needed.  - Staying in the shade or wearing long sleeves, sun glasses (UVA+UVB protection) and wide brim hats (4-inch brim around the entire circumference of the hat) are also recommended for sun protection.  - Call for new or changing lesions.  Skin cancer screening performed today.  History of Dysplastic Nevi -  No evidence of recurrence today - Recommend regular full body skin exams - Recommend daily broad spectrum sunscreen SPF 30+ to sun-exposed areas, reapply every 2 hours as needed.  - Call if any new or changing lesions are noted between office visits  Return in about 1 year (around 10/03/2022) for  TBSE.  Graciella Belton, RMA, am acting as scribe for Sarina Ser, MD . Documentation: I have reviewed the above documentation for accuracy and completeness, and I agree with the above.  Sarina Ser, MD

## 2021-10-15 ENCOUNTER — Encounter: Payer: Self-pay | Admitting: Dermatology

## 2021-12-22 IMAGING — CR DG CERVICAL SPINE COMPLETE 4+V
7 series · 7 of 7 positions shown · non-contrast
Comparison: None.

CLINICAL DATA: Pain following

EXAM:
CERVICAL SPINE - COMPLETE 4+ VIEW

[c-spine lat]
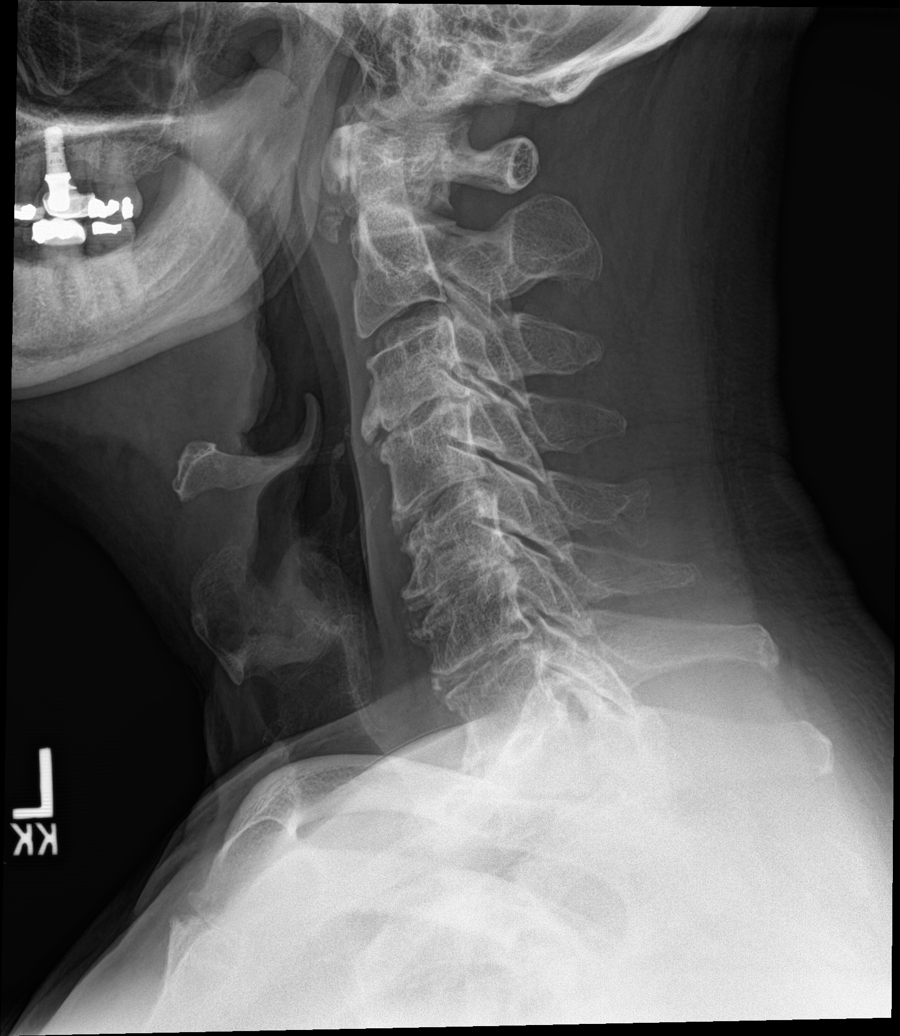

[c-spine obl (1 of 2)]
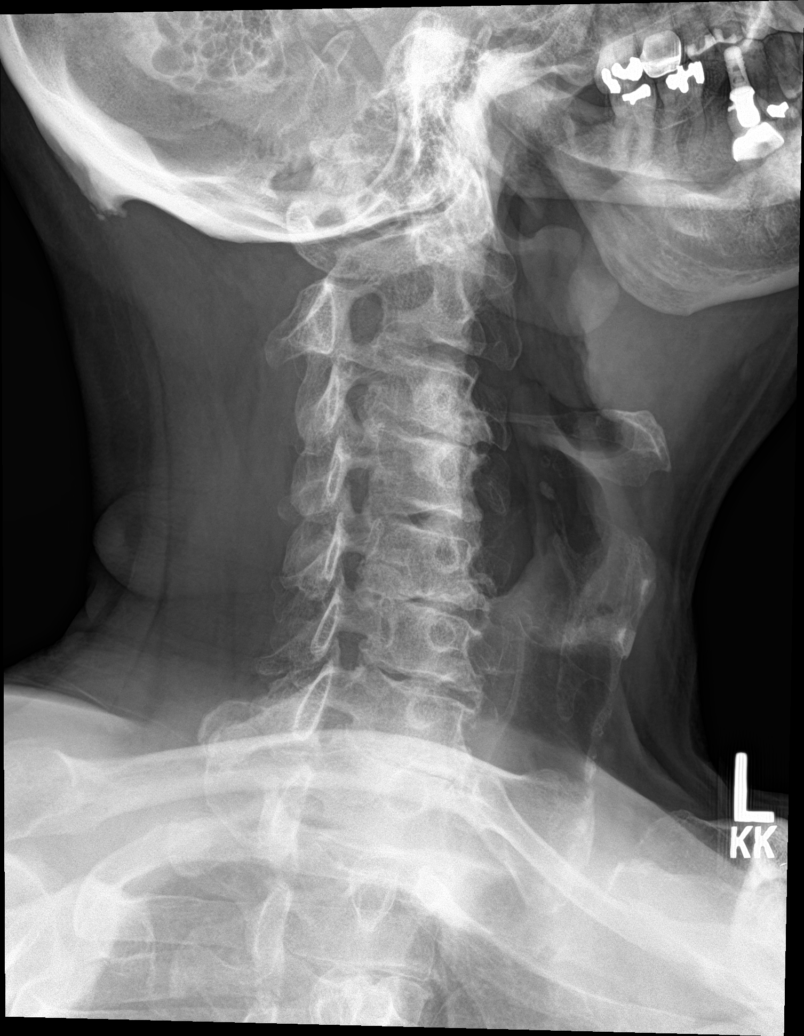

[c-spine ap]
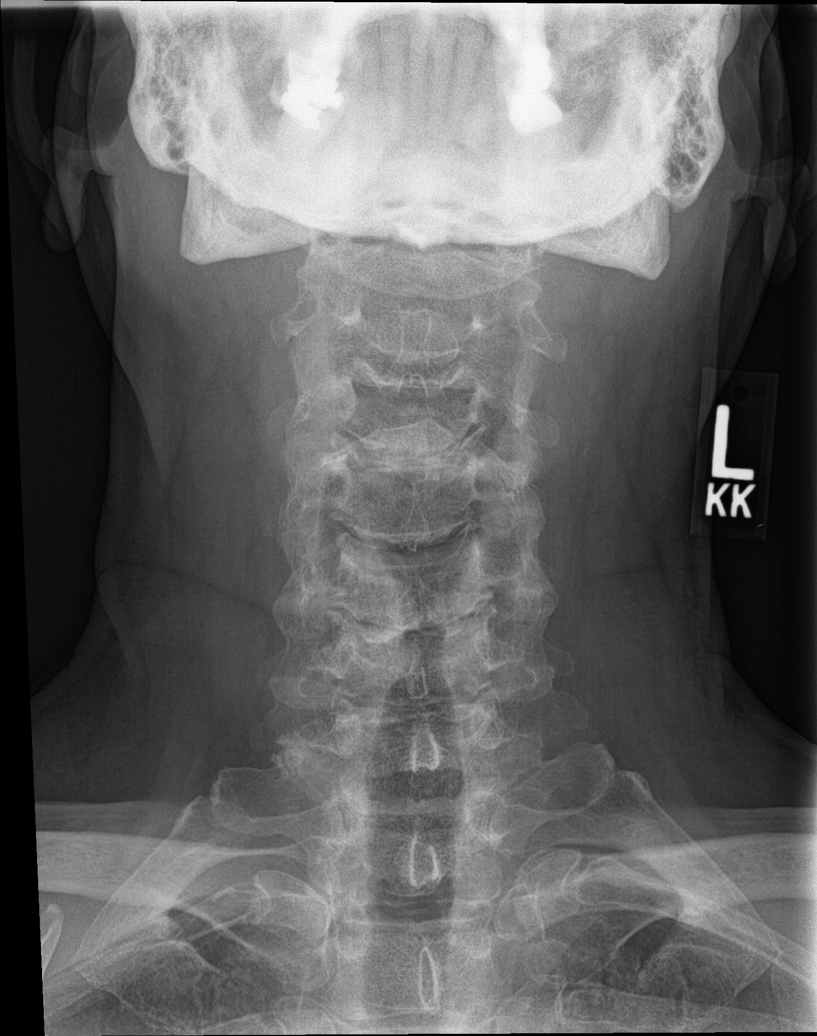

[c-spine open mouth]
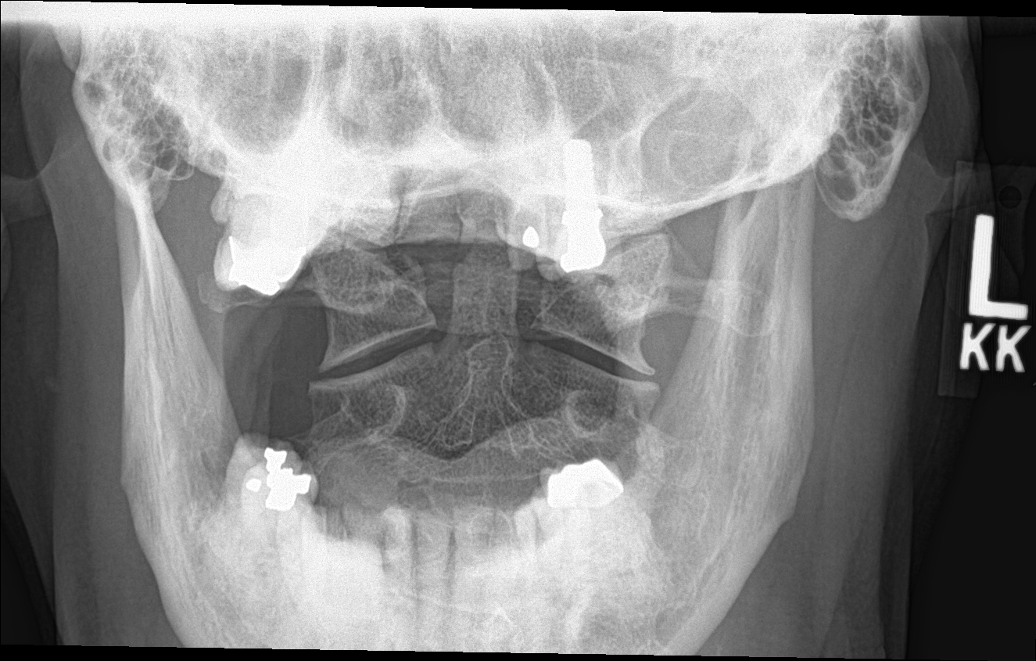

[[person_name]]
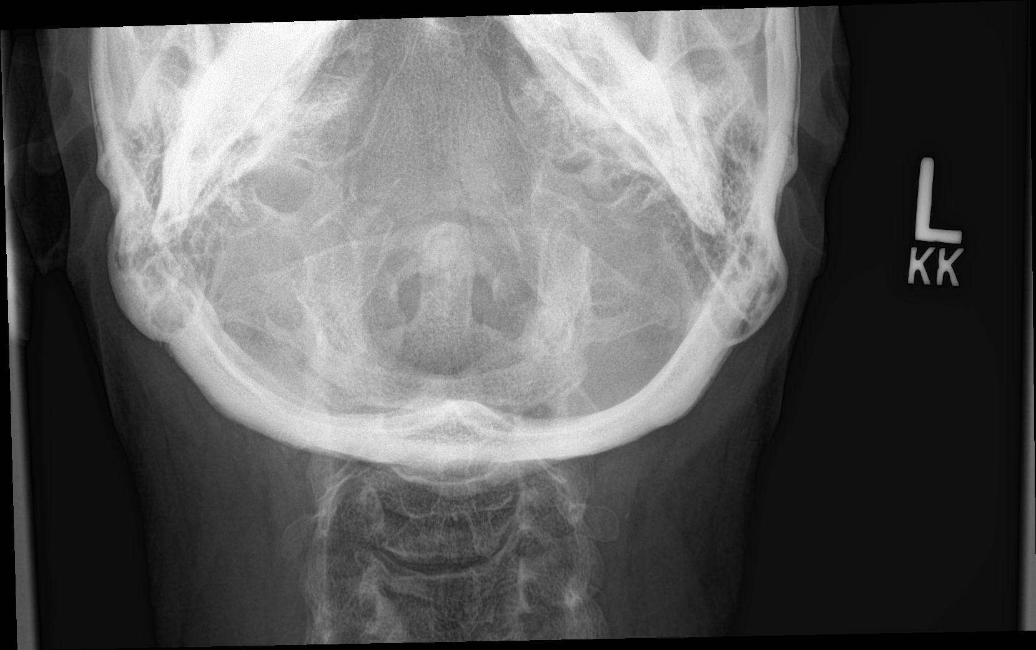

[ct-spine swimmers]
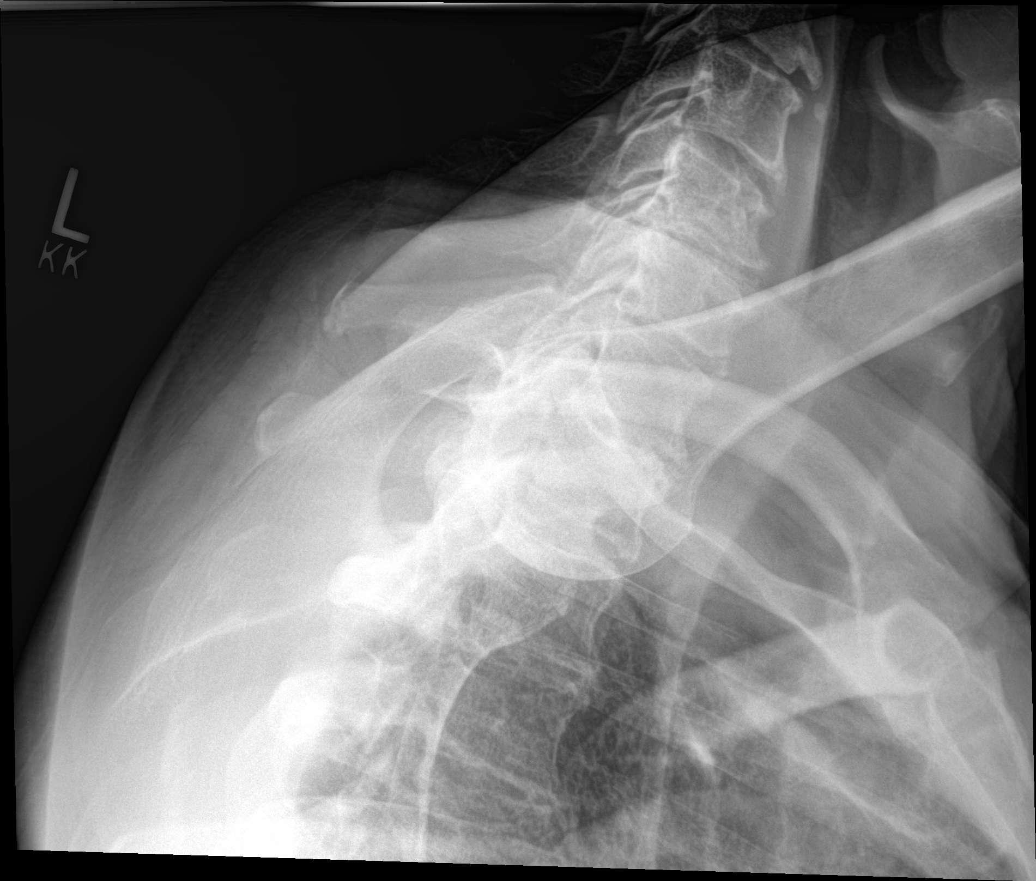

[c-spine obl (2 of 2)]
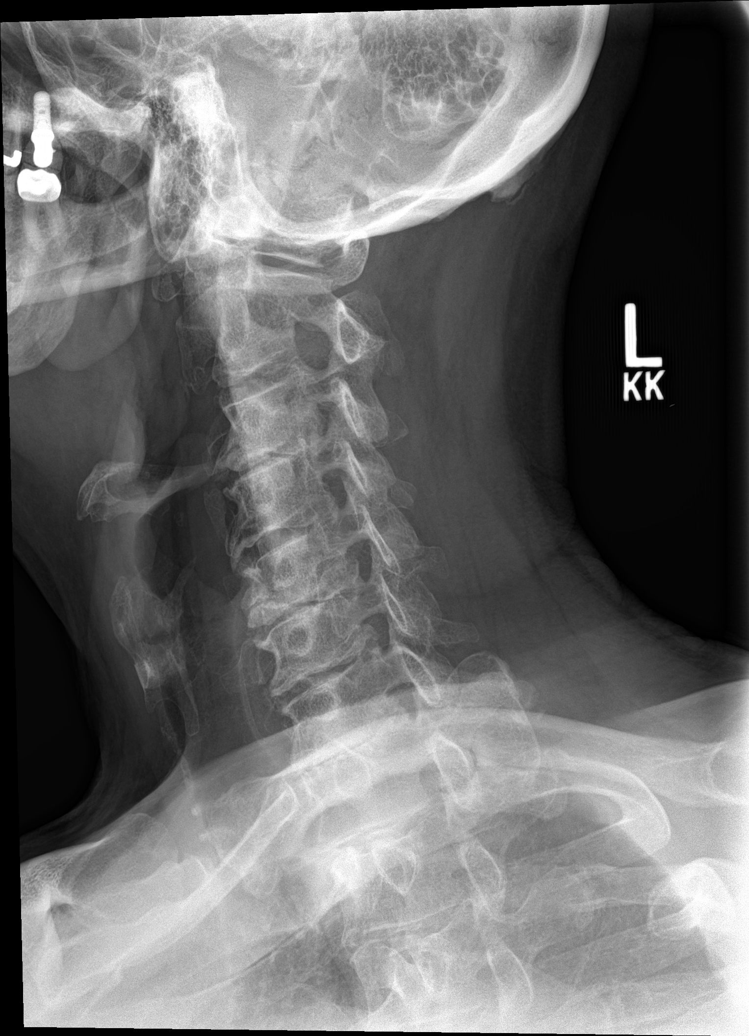

[7 of 7 positions shown; findings below may reference images not displayed]

FINDINGS: Frontal, lateral, open-mouth odontoid, and bilateral oblique views
were obtained. There is no fracture or spondylolisthesis.
Prevertebral soft tissues and predental space regions are normal.
There is moderately severe disc space narrowing at all levels except
for C2-3. There are anterior osteophytes at all levels except for
C2. There is exit foraminal narrowing at C3-4 bilaterally, C4-5
bilaterally, C5-6 bilaterally, and at C6-7 bilaterally, most severe
at C3-4 bilaterally and at C6-7 on the left. No erosive changes.
Lung apices are clear.
IMPRESSION: No evident fracture or spondylolisthesis.  Multilevel arthropathy.

## 2022-10-04 ENCOUNTER — Ambulatory Visit (INDEPENDENT_AMBULATORY_CARE_PROVIDER_SITE_OTHER): Payer: Medicare Other | Admitting: Dermatology

## 2022-10-04 VITALS — BP 139/79 | HR 84

## 2022-10-04 DIAGNOSIS — L720 Epidermal cyst: Secondary | ICD-10-CM | POA: Diagnosis not present

## 2022-10-04 DIAGNOSIS — D1801 Hemangioma of skin and subcutaneous tissue: Secondary | ICD-10-CM | POA: Diagnosis not present

## 2022-10-04 DIAGNOSIS — Z7189 Other specified counseling: Secondary | ICD-10-CM

## 2022-10-04 DIAGNOSIS — L814 Other melanin hyperpigmentation: Secondary | ICD-10-CM

## 2022-10-04 DIAGNOSIS — L578 Other skin changes due to chronic exposure to nonionizing radiation: Secondary | ICD-10-CM

## 2022-10-04 DIAGNOSIS — Z1283 Encounter for screening for malignant neoplasm of skin: Secondary | ICD-10-CM | POA: Diagnosis not present

## 2022-10-04 DIAGNOSIS — D229 Melanocytic nevi, unspecified: Secondary | ICD-10-CM

## 2022-10-04 DIAGNOSIS — Z86018 Personal history of other benign neoplasm: Secondary | ICD-10-CM

## 2022-10-04 DIAGNOSIS — L719 Rosacea, unspecified: Secondary | ICD-10-CM

## 2022-10-04 DIAGNOSIS — L821 Other seborrheic keratosis: Secondary | ICD-10-CM

## 2022-10-04 NOTE — Progress Notes (Signed)
Follow-Up Visit   Subjective  Caleb Pena is a 76 y.o. male who presents for the following: Annual Exam (History of dysplastic nevus - /The patient presents for Total-Body Skin Exam (TBSE) for skin cancer screening and mole check.  The patient has spots, moles and lesions to be evaluated, some may be new or changing and the patient has concerns that these could be cancer./).  The following portions of the chart were reviewed this encounter and updated as appropriate:   Tobacco  Allergies  Meds  Problems  Med Hx  Surg Hx  Fam Hx     Review of Systems:  No other skin or systemic complaints except as noted in HPI or Assessment and Plan.  Objective  Well appearing patient in no apparent distress; mood and affect are within normal limits.  A full examination was performed including scalp, head, eyes, ears, nose, lips, neck, chest, axillae, abdomen, back, buttocks, bilateral upper extremities, bilateral lower extremities, hands, feet, fingers, toes, fingernails, and toenails. All findings within normal limits unless otherwise noted below.  Left Upper Back 0.5 cm Subcutaneous nodule.   Head - Anterior (Face) Erythema  Left epigastric Red papule   Assessment & Plan   History of Dysplastic Nevi - No evidence of recurrence today - Recommend regular full body skin exams - Recommend daily broad spectrum sunscreen SPF 30+ to sun-exposed areas, reapply every 2 hours as needed.  - Call if any new or changing lesions are noted between office visits  Lentigines - Scattered tan macules - Due to sun exposure - Benign-appearing, observe - Recommend daily broad spectrum sunscreen SPF 30+ to sun-exposed areas, reapply every 2 hours as needed. - Call for any changes  Seborrheic Keratoses - Stuck-on, waxy, tan-brown papules and/or plaques  - Benign-appearing - Discussed benign etiology and prognosis. - Observe - Call for any changes  Melanocytic Nevi - Tan-brown and/or  pink-flesh-colored symmetric macules and papules - Benign appearing on exam today - Observation - Call clinic for new or changing moles - Recommend daily use of broad spectrum spf 30+ sunscreen to sun-exposed areas.   Hemangiomas - Red papules - Discussed benign nature - Observe - Call for any changes  Actinic Damage - Chronic condition, secondary to cumulative UV/sun exposure - diffuse scaly erythematous macules with underlying dyspigmentation - Recommend daily broad spectrum sunscreen SPF 30+ to sun-exposed areas, reapply every 2 hours as needed.  - Staying in the shade or wearing long sleeves, sun glasses (UVA+UVB protection) and wide brim hats (4-inch brim around the entire circumference of the hat) are also recommended for sun protection.  - Call for new or changing lesions.  Skin cancer screening performed today.  Epidermal inclusion cyst Left Upper Back Benign-appearing.  Observation.  Call clinic for new or changing lesions.  Recommend daily use of broad spectrum spf 30+ sunscreen to sun-exposed areas.  Benign-appearing. Exam most consistent with an epidermal inclusion cyst. Discussed that a cyst is a benign growth that can grow over time and sometimes get irritated or inflamed. Recommend observation if it is not bothersome. Discussed option of surgical excision to remove it if it is growing, symptomatic, or other changes noted. Please call for new or changing lesions so they can be evaluated.  Rosacea Head - Anterior (Face) Rosacea is a chronic progressive skin condition usually affecting the face of adults, causing redness and/or acne bumps. It is treatable but not curable. It sometimes affects the eyes (ocular rosacea) as well. It may respond to  topical and/or systemic medication and can flare with stress, sun exposure, alcohol, exercise, topical steroids (including hydrocortisone/cortisone 10) and some foods.  Daily application of broad spectrum spf 30+ sunscreen to face is  recommended to reduce flares. Counseling for BBL / IPL / Laser and Coordination of Care Discussed the treatment option of Broad Band Light (BBL) Intense Pulsed Light (IPL) / Laser.  Typically we recommend at least 1-3 treatment sessions about 5-8 weeks apart for best results.  The patient's condition may require "maintenance treatments" in the future.  The fee for BBL / laser treatments is $350 per treatment session for the whole face.  A fee can be quoted for other parts of the body. Insurance typically does not pay for BBL/laser treatments and therefore the fee is an out-of-pocket cost.  Hemangioma of skin Left epigastric Appears to be a hemangioma under dermoscopy. Benign appearing Benign-appearing.  Observation.  Call clinic for new or changing lesions.  Recommend daily use of broad spectrum spf 30+ sunscreen to sun-exposed areas.   Return in about 1 year (around 10/05/2023) for TBSE.  I, Ashok Cordia, CMA, am acting as scribe for Sarina Ser, MD . Documentation: I have reviewed the above documentation for accuracy and completeness, and I agree with the above.  Sarina Ser, MD

## 2022-10-04 NOTE — Patient Instructions (Signed)
Due to recent changes in healthcare laws, you may see results of your pathology and/or laboratory studies on MyChart before the doctors have had a chance to review them. We understand that in some cases there may be results that are confusing or concerning to you. Please understand that not all results are received at the same time and often the doctors may need to interpret multiple results in order to provide you with the best plan of care or course of treatment. Therefore, we ask that you please give us 2 business days to thoroughly review all your results before contacting the office for clarification. Should we see a critical lab result, you will be contacted sooner.   If You Need Anything After Your Visit  If you have any questions or concerns for your doctor, please call our main line at [phone] and press option 4 to reach your doctor's medical assistant. If no one answers, please leave a voicemail as directed and we will return your call as soon as possible. Messages left after 4 pm will be answered the following business day.   You may also send us a message via MyChart. We typically respond to MyChart messages within 1-2 business days.  For prescription refills, please ask your pharmacy to contact our office. Our fax number is [phone].  If you have an urgent issue when the clinic is closed that cannot wait until the next business day, you can page your doctor at the number below.    Please note that while we do our best to be available for urgent issues outside of office hours, we are not available 24/7.   If you have an urgent issue and are unable to reach us, you may choose to seek medical care at your doctor's office, retail clinic, urgent care center, or emergency room.  If you have a medical emergency, please immediately call 911 or go to the emergency department.  Pager Numbers  - Dr. Kowalski: [phone]  - Dr. Moye: [phone]  - Dr. Stewart:  [phone]  In the event of inclement weather, please call our main line at [phone] for an update on the status of any delays or closures.  Dermatology Medication Tips: Please keep the boxes that topical medications come in in order to help keep track of the instructions about where and how to use these. Pharmacies typically print the medication instructions only on the boxes and not directly on the medication tubes.   If your medication is too expensive, please contact our office at [phone] option 4 or send us a message through MyChart.   We are unable to tell what your co-pay for medications will be in advance as this is different depending on your insurance coverage. However, we may be able to find a substitute medication at lower cost or fill out paperwork to get insurance to cover a needed medication.   If a prior authorization is required to get your medication covered by your insurance company, please allow us 1-2 business days to complete this process.  Drug prices often vary depending on where the prescription is filled and some pharmacies may offer cheaper prices.  The website www.goodrx.com contains coupons for medications through different pharmacies. The prices here do not account for what the cost may be with help from insurance (it may be cheaper with your insurance), but the website can give you the price if you did not use any insurance.  - You can print the associated coupon and take it with   your prescription to the pharmacy.  - You may also stop by our office during regular business hours and pick up a GoodRx coupon card.  - If you need your prescription sent electronically to a different pharmacy, notify our office through [facility] MyChart or by phone at [phone] option 4.     Si Usted Necesita Algo Despus de Su Visita  Tambin puede enviarnos un mensaje a travs de MyChart. Por lo general respondemos a los mensajes de MyChart en el transcurso de 1 a 2  das hbiles.  Para renovar recetas, por favor pida a su farmacia que se ponga en contacto con nuestra oficina. Nuestro nmero de fax es el [phone].  Si tiene un asunto urgente cuando la clnica est cerrada y que no puede esperar hasta el siguiente da hbil, puede llamar/localizar a su doctor(a) al nmero que aparece a continuacin.   Por favor, tenga en cuenta que aunque hacemos todo lo posible para estar disponibles para asuntos urgentes fuera del horario de oficina, no estamos disponibles las 24 horas del da, los 7 das de la semana.   Si tiene un problema urgente y no puede comunicarse con nosotros, puede optar por buscar atencin mdica  en el consultorio de su doctor(a), en una clnica privada, en un centro de atencin urgente o en una sala de emergencias.  Si tiene una emergencia mdica, por favor llame inmediatamente al 911 o vaya a la sala de emergencias.  Nmeros de bper  - Dr. Kowalski: [phone]  - Dra. Moye: [phone]  - Dra. Stewart: [phone]  En caso de inclemencias del tiempo, por favor llame a nuestra lnea principal al [phone] para una actualizacin sobre el estado de cualquier retraso o cierre.  Consejos para la medicacin en dermatologa: Por favor, guarde las cajas en las que vienen los medicamentos de uso tpico para ayudarle a seguir las instrucciones sobre dnde y cmo usarlos. Las farmacias generalmente imprimen las instrucciones del medicamento slo en las cajas y no directamente en los tubos del medicamento.   Si su medicamento es muy caro, por favor, pngase en contacto con nuestra oficina llamando al [phone] y presione la opcin 4 o envenos un mensaje a travs de MyChart.   No podemos decirle cul ser su copago por los medicamentos por adelantado ya que esto es diferente dependiendo de la cobertura de su seguro. Sin embargo, es posible que podamos encontrar un medicamento sustituto a menor costo o llenar un formulario para que el  seguro cubra el medicamento que se considera necesario.   Si se requiere una autorizacin previa para que su compaa de seguros cubra su medicamento, por favor permtanos de 1 a 2 das hbiles para completar este proceso.  Los precios de los medicamentos varan con frecuencia dependiendo del lugar de dnde se surte la receta y alguna farmacias pueden ofrecer precios ms baratos.  El sitio web www.goodrx.com tiene cupones para medicamentos de diferentes farmacias. Los precios aqu no tienen en cuenta lo que podra costar con la ayuda del seguro (puede ser ms barato con su seguro), pero el sitio web puede darle el precio si no utiliz ningn seguro.  - Puede imprimir el cupn correspondiente y llevarlo con su receta a la farmacia.  - Tambin puede pasar por nuestra oficina durante el horario de atencin regular y recoger una tarjeta de cupones de GoodRx.  - Si necesita que su receta se enve electrnicamente a una farmacia diferente, informe a nuestra oficina a travs de MyChart de [facility]   o por telfono llamando al [phone] y presione la opcin 4.  

## 2022-10-13 ENCOUNTER — Encounter: Payer: Self-pay | Admitting: Dermatology

## 2023-10-04 ENCOUNTER — Ambulatory Visit: Payer: Medicare Other | Admitting: Dermatology

## 2023-10-04 DIAGNOSIS — D1801 Hemangioma of skin and subcutaneous tissue: Secondary | ICD-10-CM

## 2023-10-04 DIAGNOSIS — L570 Actinic keratosis: Secondary | ICD-10-CM | POA: Diagnosis not present

## 2023-10-04 DIAGNOSIS — L814 Other melanin hyperpigmentation: Secondary | ICD-10-CM

## 2023-10-04 DIAGNOSIS — L820 Inflamed seborrheic keratosis: Secondary | ICD-10-CM

## 2023-10-04 DIAGNOSIS — Z1283 Encounter for screening for malignant neoplasm of skin: Secondary | ICD-10-CM | POA: Diagnosis not present

## 2023-10-04 DIAGNOSIS — L719 Rosacea, unspecified: Secondary | ICD-10-CM

## 2023-10-04 DIAGNOSIS — L821 Other seborrheic keratosis: Secondary | ICD-10-CM

## 2023-10-04 DIAGNOSIS — Z86018 Personal history of other benign neoplasm: Secondary | ICD-10-CM

## 2023-10-04 DIAGNOSIS — D229 Melanocytic nevi, unspecified: Secondary | ICD-10-CM

## 2023-10-04 DIAGNOSIS — W908XXA Exposure to other nonionizing radiation, initial encounter: Secondary | ICD-10-CM

## 2023-10-04 DIAGNOSIS — L578 Other skin changes due to chronic exposure to nonionizing radiation: Secondary | ICD-10-CM

## 2023-10-04 DIAGNOSIS — Z7189 Other specified counseling: Secondary | ICD-10-CM

## 2023-10-04 DIAGNOSIS — Z872 Personal history of diseases of the skin and subcutaneous tissue: Secondary | ICD-10-CM

## 2023-10-04 NOTE — Patient Instructions (Addendum)

## 2023-10-04 NOTE — Progress Notes (Signed)
Follow-Up Visit   Subjective  Caleb Pena is a 77 y.o. male who presents for the following: Skin Cancer Screening and Full Body Skin Exam Hx of rosacea no trx, hx of dysplastic, hx of epidermal cyst , hx of isks   The patient presents for Total-Body Skin Exam (TBSE) for skin cancer screening and mole check. The patient has spots, moles and lesions to be evaluated, some may be new or changing and the patient may have concern these could be cancer.    The following portions of the chart were reviewed this encounter and updated as appropriate: medications, allergies, medical history  Review of Systems:  No other skin or systemic complaints except as noted in HPI or Assessment and Plan.  Objective  Well appearing patient in no apparent distress; mood and affect are within normal limits.  A full examination was performed including scalp, head, eyes, ears, nose, lips, neck, chest, axillae, abdomen, back, buttocks, bilateral upper extremities, bilateral lower extremities, hands, feet, fingers, toes, fingernails, and toenails. All findings within normal limits unless otherwise noted below.   Relevant physical exam findings are noted in the Assessment and Plan.  face x 2 (2) Erythematous thin papules/macules with gritty scale.  right axilla x 1 Erythematous stuck-on, waxy papule or plaque  Assessment & Plan   SKIN CANCER SCREENING PERFORMED TODAY.  ACTINIC DAMAGE - Chronic condition, secondary to cumulative UV/sun exposure - diffuse scaly erythematous macules with underlying dyspigmentation - Recommend daily broad spectrum sunscreen SPF 30+ to sun-exposed areas, reapply every 2 hours as needed.  - Staying in the shade or wearing long sleeves, sun glasses (UVA+UVB protection) and wide brim hats (4-inch brim around the entire circumference of the hat) are also recommended for sun protection.  - Call for new or changing lesions.  LENTIGINES, SEBORRHEIC KERATOSES, HEMANGIOMAS - Benign  normal skin lesions - Benign-appearing - Call for any changes  MELANOCYTIC NEVI - Tan-brown and/or pink-flesh-colored symmetric macules and papules - Benign appearing on exam today - Observation - Call clinic for new or changing moles - Recommend daily use of broad spectrum spf 30+ sunscreen to sun-exposed areas.   Rosacea Head - Anterior (Face  Mild erythema at face  Chronic and persistent condition with duration or expected duration over one year. Condition is symptomatic / bothersome to patient. Not to goal.  Rosacea is a chronic progressive skin condition usually affecting the face of adults, causing redness and/or acne bumps. It is treatable but not curable. It sometimes affects the eyes (ocular rosacea) as well. It may respond to topical and/or systemic medication and can flare with stress, sun exposure, alcohol, exercise, topical steroids (including hydrocortisone/cortisone 10) and some foods.  Daily application of broad spectrum spf 30+ sunscreen to face is recommended to reduce flares. Counseling for BBL / IPL / Laser and Coordination of Care Discussed the treatment option of Broad Band Light (BBL) Intense Pulsed Light (IPL) / Laser.  Typically we recommend at least 1-3 treatment sessions about 5-8 weeks apart for best results.  The patient's condition may require "maintenance treatments" in the future.  The fee for BBL / laser treatments is $350 per treatment session for the whole face.  A fee can be quoted for other parts of the body. Insurance typically does not pay for BBL/laser treatments and therefore the fee is an out-of-pocket cost.   Hemangioma of skin Left epigastric Exam   Appears to be a hemangioma under dermoscopy. Benign appearing Benign-appearing.  Observation.  Call clinic  for new or changing lesions.  Recommend daily use of broad spectrum spf 30+ sunscreen to sun-exposed areas.   HISTORY OF DYSPLASTIC NEVUS 09/24/2019 Right mid dorsum forearm - moderate atypia  close to margin  No evidence of recurrence today Recommend regular full body skin exams Recommend daily broad spectrum sunscreen SPF 30+ to sun-exposed areas, reapply every 2 hours as needed.  Call if any new or changing lesions are noted between office visits   ACTINIC KERATOSIS (2) face x 2 (2) Actinic keratoses are precancerous spots that appear secondary to cumulative UV radiation exposure/sun exposure over time. They are chronic with expected duration over 1 year. A portion of actinic keratoses will progress to squamous cell carcinoma of the skin. It is not possible to reliably predict which spots will progress to skin cancer and so treatment is recommended to prevent development of skin cancer.  Recommend daily broad spectrum sunscreen SPF 30+ to sun-exposed areas, reapply every 2 hours as needed.  Recommend staying in the shade or wearing long sleeves, sun glasses (UVA+UVB protection) and wide brim hats (4-inch brim around the entire circumference of the hat). Call for new or changing lesions. Destruction of lesion - face x 2 (2) Complexity: simple   Destruction method: cryotherapy   Informed consent: discussed and consent obtained   Timeout:  patient name, date of birth, surgical site, and procedure verified Lesion destroyed using liquid nitrogen: Yes   Region frozen until ice ball extended beyond lesion: Yes   Outcome: patient tolerated procedure well with no complications   Post-procedure details: wound care instructions given   INFLAMED SEBORRHEIC KERATOSIS right axilla x 1 Symptomatic, irritating, patient would like treated. Destruction of lesion - right axilla x 1 Complexity: simple   Destruction method: cryotherapy   Informed consent: discussed and consent obtained   Timeout:  patient name, date of birth, surgical site, and procedure verified Lesion destroyed using liquid nitrogen: Yes   Region frozen until ice ball extended beyond lesion: Yes   Outcome: patient  tolerated procedure well with no complications   Post-procedure details: wound care instructions given   Return in about 1 year (around 10/03/2024) for TBSE.  IAsher Muir, CMA, am acting as scribe for Armida Sans, MD.   Documentation: I have reviewed the above documentation for accuracy and completeness, and I agree with the above.  Armida Sans, MD

## 2023-10-14 ENCOUNTER — Encounter: Payer: Self-pay | Admitting: Dermatology

## 2024-06-19 ENCOUNTER — Encounter: Payer: Self-pay | Admitting: Dermatology

## 2024-06-19 ENCOUNTER — Ambulatory Visit: Admitting: Dermatology

## 2024-06-19 DIAGNOSIS — L821 Other seborrheic keratosis: Secondary | ICD-10-CM | POA: Diagnosis not present

## 2024-06-19 NOTE — Patient Instructions (Signed)

## 2024-06-19 NOTE — Progress Notes (Signed)
   Follow-Up Visit   Subjective  Caleb Pena is a 78 y.o. male who presents for the following: Irregular skin lesions on the chest x 2 rough and changing in appearance.   The following portions of the chart were reviewed this encounter and updated as appropriate: medications, allergies, medical history  Review of Systems:  No other skin or systemic complaints except as noted in HPI or Assessment and Plan.  Objective  Well appearing patient in no apparent distress; mood and affect are within normal limits.   A focused examination was performed of the following areas:    Relevant exam findings are noted in the Assessment and Plan.    Assessment & Plan   SEBORRHEIC KERATOSIS - chest x 2 - Stuck-on, waxy, tan-brown papules and/or plaques  - Benign-appearing - Discussed benign etiology and prognosis. - Observe - Call for any changes SEBORRHEIC KERATOSES    Return for appointment as scheduled.  Caleb Pena, CMA, am acting as scribe for Boneta Sharps, MD .   Documentation: I have reviewed the above documentation for accuracy and completeness, and I agree with the above.  Boneta Sharps, MD

## 2024-10-23 ENCOUNTER — Ambulatory Visit: Payer: Medicare Other | Admitting: Dermatology

## 2024-10-23 ENCOUNTER — Encounter: Payer: Self-pay | Admitting: Dermatology

## 2024-10-23 DIAGNOSIS — Z1283 Encounter for screening for malignant neoplasm of skin: Secondary | ICD-10-CM | POA: Diagnosis not present

## 2024-10-23 DIAGNOSIS — L719 Rosacea, unspecified: Secondary | ICD-10-CM | POA: Diagnosis not present

## 2024-10-23 DIAGNOSIS — D1801 Hemangioma of skin and subcutaneous tissue: Secondary | ICD-10-CM

## 2024-10-23 DIAGNOSIS — Z86018 Personal history of other benign neoplasm: Secondary | ICD-10-CM

## 2024-10-23 DIAGNOSIS — I781 Nevus, non-neoplastic: Secondary | ICD-10-CM

## 2024-10-23 DIAGNOSIS — L814 Other melanin hyperpigmentation: Secondary | ICD-10-CM

## 2024-10-23 DIAGNOSIS — L821 Other seborrheic keratosis: Secondary | ICD-10-CM | POA: Diagnosis not present

## 2024-10-23 DIAGNOSIS — D229 Melanocytic nevi, unspecified: Secondary | ICD-10-CM

## 2024-10-23 DIAGNOSIS — Z7189 Other specified counseling: Secondary | ICD-10-CM

## 2024-10-23 DIAGNOSIS — L578 Other skin changes due to chronic exposure to nonionizing radiation: Secondary | ICD-10-CM

## 2024-10-23 DIAGNOSIS — W908XXA Exposure to other nonionizing radiation, initial encounter: Secondary | ICD-10-CM

## 2024-10-23 DIAGNOSIS — L820 Inflamed seborrheic keratosis: Secondary | ICD-10-CM | POA: Diagnosis not present

## 2024-10-23 NOTE — Patient Instructions (Addendum)

## 2024-10-23 NOTE — Progress Notes (Signed)
 "  Follow-Up Visit   Subjective  Caleb Pena is a 79 y.o. male who presents for the following: Skin Cancer Screening and Full Body Skin Exam, hx of Dysplastic nevus  The patient presents for Total-Body Skin Exam (TBSE) for skin cancer screening and mole check. The patient has spots, moles and lesions to be evaluated, some may be new or changing and the patient may have concern these could be cancer.  The following portions of the chart were reviewed this encounter and updated as appropriate: medications, allergies, medical history  Review of Systems:  No other skin or systemic complaints except as noted in HPI or Assessment and Plan.  Objective  Well appearing patient in no apparent distress; mood and affect are within normal limits.  A full examination was performed including scalp, head, eyes, ears, nose, lips, neck, chest, axillae, abdomen, back, buttocks, bilateral upper extremities, bilateral lower extremities, hands, feet, fingers, toes, fingernails, and toenails. All findings within normal limits unless otherwise noted below.   Relevant physical exam findings are noted in the Assessment and Plan.  face,chest,arms,neck (20) Stuck-on, waxy, tan-brown papule or plaque --Discussed benign etiology and prognosis.   Assessment & Plan   SKIN CANCER SCREENING PERFORMED TODAY.  ACTINIC DAMAGE - Chronic condition, secondary to cumulative UV/sun exposure - diffuse scaly erythematous macules with underlying dyspigmentation - Recommend daily broad spectrum sunscreen SPF 30+ to sun-exposed areas, reapply every 2 hours as needed.  - Staying in the shade or wearing long sleeves, sun glasses (UVA+UVB protection) and wide brim hats (4-inch brim around the entire circumference of the hat) are also recommended for sun protection.  - Call for new or changing lesions.  LENTIGINES, SEBORRHEIC KERATOSES, HEMANGIOMAS - Benign normal skin lesions - Benign-appearing - Call for any  changes  MELANOCYTIC NEVI - Tan-brown and/or pink-flesh-colored symmetric macules and papules - Benign appearing on exam today - Observation - Call clinic for new or changing moles - Recommend daily use of broad spectrum spf 30+ sunscreen to sun-exposed areas.   Rosacea face Rosacea is a chronic progressive skin condition usually affecting the face of adults, causing redness and/or acne bumps. It is treatable but not curable. It sometimes affects the eyes (ocular rosacea) as well. It may respond to topical and/or systemic medication and can flare with stress, sun exposure, alcohol, exercise and some foods.  Daily application of broad spectrum spf 30+ sunscreen to face is recommended to reduce flares. Counseling for BBL / IPL / Laser and Coordination of Care Discussed the treatment option of Broad Band Light (BBL) /Intense Pulsed Light (IPL)/ Laser for skin discoloration, including brown spots and redness.  Typically we recommend at least 1-3 treatment sessions about 5-8 weeks apart for best results.  Cannot have tanned skin when BBL performed, and regular use of sunscreen/photoprotection is advised after the procedure to help maintain results. The patient's condition may also require maintenance treatments in the future.  The fee for BBL / laser treatments is $350 per treatment session for the whole face.  A fee can be quoted for other parts of the body.  Insurance typically does not pay for BBL/laser treatments and therefore the fee is an out-of-pocket cost. Recommend prophylactic valtrex treatment. Once scheduled for procedure, will send Rx in prior to patient's appointment.  Patient decline treatment    HISTORY OF DYSPLASTIC NEVUS Right mid dorsum forearm No evidence of recurrence today Recommend regular full body skin exams Recommend daily broad spectrum sunscreen SPF 30+ to sun-exposed areas, reapply  every 2 hours as needed.  Call if any new or changing lesions are noted between office  visits    INFLAMED SEBORRHEIC KERATOSIS (20) face,chest,arms,neck (20) Symptomatic, irritating, patient would like treated.  - Destruction of lesion - face,chest,arms,neck (20) Complexity: simple   Destruction method: cryotherapy   Informed consent: discussed and consent obtained   Timeout:  patient name, date of birth, surgical site, and procedure verified Lesion destroyed using liquid nitrogen: Yes   Region frozen until ice ball extended beyond lesion: Yes   Outcome: patient tolerated procedure well with no complications   Post-procedure details: wound care instructions given      Return in about 1 year (around 10/23/2025) for TBSE, hx of Dysplastic nevus .  IFay Kirks, CMA, am acting as scribe for Alm Rhyme, MD .   Documentation: I have reviewed the above documentation for accuracy and completeness, and I agree with the above.  Alm Rhyme, MD    "

## 2025-10-27 ENCOUNTER — Ambulatory Visit: Admitting: Dermatology
